# Patient Record
Sex: Female | Born: 1975 | Race: White | Hispanic: No | Marital: Married | Smoking: Never smoker
Health system: Southern US, Community
[De-identification: ages and names within clinical notes are randomized; demographics above are authoritative.]

## PROBLEM LIST (undated history)

## (undated) DIAGNOSIS — E039 Hypothyroidism, unspecified: Secondary | ICD-10-CM

## (undated) DIAGNOSIS — E559 Vitamin D deficiency, unspecified: Secondary | ICD-10-CM

## (undated) DIAGNOSIS — S0300XA Dislocation of jaw, unspecified side, initial encounter: Secondary | ICD-10-CM

## (undated) HISTORY — DX: Hypothyroidism, unspecified: E03.9

## (undated) HISTORY — PX: MOUTH SURGERY: SHX715

## (undated) HISTORY — DX: Dislocation of jaw, unspecified side, initial encounter: S03.00XA

## (undated) HISTORY — DX: Vitamin D deficiency, unspecified: E55.9

---

## 2000-05-25 ENCOUNTER — Other Ambulatory Visit: Admission: RE | Admit: 2000-05-25 | Discharge: 2000-05-25 | Payer: Self-pay | Admitting: Gynecology

## 2000-12-18 ENCOUNTER — Inpatient Hospital Stay (HOSPITAL_COMMUNITY): Admission: AD | Admit: 2000-12-18 | Discharge: 2000-12-20 | Payer: Self-pay | Admitting: Gynecology

## 2000-12-18 ENCOUNTER — Encounter (INDEPENDENT_AMBULATORY_CARE_PROVIDER_SITE_OTHER): Payer: Self-pay | Admitting: Specialist

## 2001-01-28 ENCOUNTER — Other Ambulatory Visit: Admission: RE | Admit: 2001-01-28 | Discharge: 2001-01-28 | Payer: Self-pay | Admitting: Gynecology

## 2002-07-07 ENCOUNTER — Other Ambulatory Visit: Admission: RE | Admit: 2002-07-07 | Discharge: 2002-07-07 | Payer: Self-pay | Admitting: Obstetrics and Gynecology

## 2003-01-22 ENCOUNTER — Inpatient Hospital Stay (HOSPITAL_COMMUNITY): Admission: AD | Admit: 2003-01-22 | Discharge: 2003-01-24 | Payer: Self-pay | Admitting: Obstetrics and Gynecology

## 2003-03-07 ENCOUNTER — Other Ambulatory Visit: Admission: RE | Admit: 2003-03-07 | Discharge: 2003-03-07 | Payer: Self-pay | Admitting: Obstetrics and Gynecology

## 2004-07-02 ENCOUNTER — Other Ambulatory Visit: Admission: RE | Admit: 2004-07-02 | Discharge: 2004-07-02 | Payer: Self-pay | Admitting: Obstetrics and Gynecology

## 2004-12-04 ENCOUNTER — Other Ambulatory Visit: Admission: RE | Admit: 2004-12-04 | Discharge: 2004-12-04 | Payer: Self-pay | Admitting: Obstetrics and Gynecology

## 2007-10-06 ENCOUNTER — Ambulatory Visit (HOSPITAL_COMMUNITY): Admission: RE | Admit: 2007-10-06 | Discharge: 2007-10-06 | Payer: Self-pay | Admitting: Obstetrics and Gynecology

## 2008-06-29 ENCOUNTER — Inpatient Hospital Stay (HOSPITAL_COMMUNITY): Admission: RE | Admit: 2008-06-29 | Discharge: 2008-07-01 | Payer: Self-pay | Admitting: Obstetrics and Gynecology

## 2010-05-20 LAB — CBC
Hemoglobin: 9.9 g/dL — ABNORMAL LOW (ref 12.0–15.0)
MCV: 89.9 fL (ref 78.0–100.0)
Platelets: 155 10*3/uL (ref 150–400)
RBC: 3.12 MIL/uL — ABNORMAL LOW (ref 3.87–5.11)
RDW: 12.8 % (ref 11.5–15.5)
RDW: 13.1 % (ref 11.5–15.5)
WBC: 8.5 10*3/uL (ref 4.0–10.5)

## 2010-05-20 LAB — RPR: RPR Ser Ql: NONREACTIVE

## 2010-06-27 NOTE — Discharge Summary (Signed)
[facility]  Patient:    Danielle Jenkins, Danielle Jenkins Visit Number: [id number] MRN: [id number]          Service Type: OBS Location: [id number] Attending Physician:  Douglass Rivers Dictated by:   Antony Contras, [facility] Admit Date:  12/18/2000 Discharge Date: 12/20/2000                             Discharge Summary  DISCHARGE DIAGNOSES: 1. Intrauterine pregnancy at term with spontaneous onset of labor. 2. History of positive ANA.  PROCEDURES:  Normal spontaneous vaginal delivery with delivery of a viable infant over a second degree midline episiotomy.  HISTORY OF PRESENT ILLNESS:  The patient is a 35 year old primigravida with a last menstrual period of 03/16/00, estimated date of confinement 12/19/00. Prenatal course was complicated with a history of positive ANA for which the patient was treated with baby aspirin.  LABORATORY DATA:  Blood type A+, antibody screen negative, RPR nonreactive. Rubella immune.  GBS is positive.  HOSPITAL COURSE AND TREATMENT:  The patient was admitted on 12/18/00, spontaneous onset of labor.  Cervix was 7 to 8 cm, 80%, -3 station.  The patient progressed rapidly to complete dilatation.  Delivered an Apgar 8 and 10 female infant weighing 6 pounds 7 ounces over a second degree midline episiotomy.  There was some thick meconium fluid.  Thick ______ meconium. The infant was DeLee suctioned, peds in attendance.  POSTPARTUM COURSE:  The patient remained afebrile.  Was able to be discharged on her second postpartum day in satisfactory condition.  CBC 30.6, hemoglobin 10.9, white blood cell count 10.2, platelets 125.  DISPOSITION:  Follow up in six weeks.  DISCHARGE MEDICATIONS: 1. Prenatal vitamins. 2. Iron. 3. Motrin. 4. Tylox. Dictated by:   Antony Contras, [facility] Attending Physician:  Douglass Rivers DD:  12/31/00 TD:  01/01/01 Job: [id number]

## 2010-11-28 ENCOUNTER — Encounter: Payer: Self-pay | Admitting: Genetic Counselor

## 2012-11-22 ENCOUNTER — Other Ambulatory Visit: Payer: Self-pay | Admitting: Obstetrics and Gynecology

## 2012-11-22 DIAGNOSIS — R928 Other abnormal and inconclusive findings on diagnostic imaging of breast: Secondary | ICD-10-CM

## 2012-12-05 ENCOUNTER — Ambulatory Visit
Admission: RE | Admit: 2012-12-05 | Discharge: 2012-12-05 | Disposition: A | Discharge status: Home / Self Care | Payer: Private Health Insurance - Indemnity | Source: Ambulatory Visit | Attending: Obstetrics and Gynecology | Admitting: Obstetrics and Gynecology

## 2012-12-05 DIAGNOSIS — R928 Other abnormal and inconclusive findings on diagnostic imaging of breast: Secondary | ICD-10-CM

## 2013-05-08 ENCOUNTER — Ambulatory Visit: Payer: Managed Care, Other (non HMO) | Admitting: Emergency Medicine

## 2013-05-08 VITALS — BP 106/62 | HR 89 | Temp 102.1°F | Resp 18 | Ht 65.0 in | Wt 161.8 lb

## 2013-05-08 DIAGNOSIS — J111 Influenza due to unidentified influenza virus with other respiratory manifestations: Secondary | ICD-10-CM

## 2013-05-08 MED ORDER — PROMETHAZINE-CODEINE 6.25-10 MG/5ML PO SYRP: 5.0000 mL | ORAL_SOLUTION | Freq: Four times a day (QID) | ORAL | Status: DC | PRN

## 2013-05-08 MED ORDER — OSELTAMIVIR PHOSPHATE 75 MG PO CAPS: 75.0000 mg | ORAL_CAPSULE | Freq: Two times a day (BID) | ORAL | Status: DC

## 2013-05-08 NOTE — Patient Instructions (Signed)

## 2013-05-08 NOTE — Progress Notes (Signed)
[facility] [address], [place] [address] [phone]  Date:  05/08/2013   Name:  Danielle Jenkins   DOB:  Dec 06, 1975   MRN:  [id number]  PCP:  No primary provider on file.    Chief Complaint: Cough, Fever and Generalized Body Aches   History of Present Illness:  Danielle Jenkins is a 38 y.o. very pleasant female patient who presents with the following:  Ill today with fever to 102, myalgias, fatigue, and a cough.  No wheezing or shortness of breath.  Nauseated but no vomiting.  No stool change or rash.  No ill contact.  Cough is not productive.  No rash or sore throat.  Sudden onset of symptoms this morning.  No improvement with over the counter medications or other home remedies. Denies other complaint or health concern today.   There are no active problems to display for this patient.   No past medical history on file.  No past surgical history on file.  History  Substance Use Topics  . Smoking status: Never Smoker   . Smokeless tobacco: Never Used  . Alcohol Use: Yes     Comment: occ    No family history on file.  No Known Allergies  Medication list has been reviewed and updated.  No current outpatient prescriptions on file prior to visit.   No current facility-administered medications on file prior to visit.    Review of Systems:  As per HPI, otherwise negative.    Physical Examination: Filed Vitals:   05/08/13 1807  BP: 106/62  Pulse: 89  Temp: 102.1 F (38.9 C)  Resp: 18   Filed Vitals:   05/08/13 1807  Height: 5' 5"  (1.651 m)  Weight: 161 lb 12.8 oz (73.392 kg)   Body mass index is 26.92 kg/(m^2). Ideal Body Weight: Weight in (lb) to have BMI = 25: 149.9  GEN: WDWN, NAD, Non-toxic, A & O x 3 HEENT: Atraumatic, Normocephalic. Neck supple. No masses, No LAD. Ears and Nose: No external deformity. CV: RRR, No M/G/R. No JVD. No thrill. No extra heart sounds. PULM: CTA B, no wheezes, crackles, rhonchi. No retractions. No  resp. distress. No accessory muscle use. ABD: S, NT, ND, +BS. No rebound. No HSM. EXTR: No c/c/e NEURO Normal gait.  PSYCH: Normally interactive. Conversant. Not depressed or anxious appearing.  Calm demeanor.    Assessment and Plan: Influenza tamiflu Phen c cod  Signed,  Ellison Carwin, MD

## 2013-07-11 ENCOUNTER — Other Ambulatory Visit: Payer: Self-pay | Admitting: Obstetrics and Gynecology

## 2013-07-11 DIAGNOSIS — R922 Inconclusive mammogram: Secondary | ICD-10-CM

## 2014-01-22 ENCOUNTER — Ambulatory Visit (INDEPENDENT_AMBULATORY_CARE_PROVIDER_SITE_OTHER): Payer: Managed Care, Other (non HMO) | Admitting: Internal Medicine

## 2014-01-22 DIAGNOSIS — M25512 Pain in left shoulder: Secondary | ICD-10-CM

## 2014-01-22 DIAGNOSIS — M542 Cervicalgia: Secondary | ICD-10-CM

## 2014-01-22 MED ORDER — MELOXICAM 15 MG PO TABS: 15.0000 mg | ORAL_TABLET | Freq: Every day | ORAL | Status: DC

## 2014-01-22 MED ORDER — CYCLOBENZAPRINE HCL 10 MG PO TABS
10.0000 mg | ORAL_TABLET | Freq: Every day | ORAL | Status: DC
Start: 2014-01-22 — End: 2021-05-02
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
## 2014-01-25 ENCOUNTER — Other Ambulatory Visit: Payer: Self-pay | Admitting: Obstetrics and Gynecology

## 2014-01-25 DIAGNOSIS — Z9189 Other specified personal risk factors, not elsewhere classified: Secondary | ICD-10-CM

## 2014-01-25 DIAGNOSIS — R922 Inconclusive mammogram: Secondary | ICD-10-CM

## 2014-01-28 ENCOUNTER — Ambulatory Visit (INDEPENDENT_AMBULATORY_CARE_PROVIDER_SITE_OTHER): Payer: Managed Care, Other (non HMO) | Admitting: Physician Assistant

## 2014-01-28 VITALS — BP 114/78 | HR 78 | Temp 98.3°F | Resp 18 | Ht 64.75 in | Wt 162.2 lb

## 2014-01-28 DIAGNOSIS — J029 Acute pharyngitis, unspecified: Secondary | ICD-10-CM

## 2014-01-28 LAB — POCT RAPID STREP A (OFFICE): Rapid Strep A Screen: NEGATIVE

## 2014-01-28 NOTE — Progress Notes (Signed)
   Subjective:    Patient ID: Danielle Jenkins, female    DOB: 09/17/1975, 38 y.o.   MRN: [id number]  HPI  This is a 38 year old female with no significant PMH presenting with sore throat x 1 day. She was in clinic today with her daughter with same symptoms who tested positive for strep. She is wanting to be tested as well. She denies any additional symptoms - no fever, chills, otalgia, nasal congestion, sinus pressure, cough, abdominal pain, N/V/D. She has not tried anything for her symptoms.   Review of Systems  Constitutional: Negative for fever and chills.  HENT: Positive for sore throat. Negative for congestion, ear pain and sinus pressure.   Eyes: Negative for redness.  Respiratory: Negative for cough.   Gastrointestinal: Negative for nausea, vomiting, abdominal pain and diarrhea.  Skin: Negative for rash.  Neurological: Negative for headaches.  Psychiatric/Behavioral: Negative for sleep disturbance.    There are no active problems to display for this patient.  Prior to Admission medications   Medication Sig Start Date End Date Taking? Authorizing Provider  cyclobenzaprine (FLEXERIL) 10 MG tablet Take 1 tablet (10 mg total) by mouth at bedtime. 01/22/14  Yes Leandrew Koyanagi, MD  meloxicam (MOBIC) 15 MG tablet Take 1 tablet (15 mg total) by mouth daily. 01/22/14  Yes Leandrew Koyanagi, MD   No Known Allergies  Patient's social and family history were reviewed.     Objective:   Physical Exam  Constitutional: She is oriented to person, place, and time. She appears well-developed and well-nourished. No distress.  HENT:  Head: Normocephalic and atraumatic.  Right Ear: Hearing, tympanic membrane, external ear and ear canal normal.  Left Ear: Hearing, tympanic membrane, external ear and ear canal normal.  Nose: Nose normal.  Mouth/Throat: Uvula is midline and mucous membranes are normal. Posterior oropharyngeal erythema present. No oropharyngeal exudate or posterior  oropharyngeal edema.  Eyes: Conjunctivae and lids are normal. Right eye exhibits no discharge. Left eye exhibits no discharge. No scleral icterus.  Neck: Trachea normal.  Cardiovascular: Normal rate, regular rhythm, normal heart sounds, intact distal pulses and normal pulses.   No murmur heard. Pulmonary/Chest: Effort normal and breath sounds normal. No respiratory distress. She has no wheezes. She has no rhonchi. She has no rales.  Musculoskeletal: Normal range of motion.  Lymphadenopathy:    She has no cervical adenopathy.  Neurological: She is alert and oriented to person, place, and time.  Skin: Skin is warm, dry and intact. No lesion and no rash noted.  Psychiatric: She has a normal mood and affect. Her speech is normal and behavior is normal. Thought content normal.   Results for orders placed or performed in visit on 01/28/14  POCT rapid strep A  Result Value Ref Range   Rapid Strep A Screen Negative Negative      Assessment & Plan:  1. Sore throat Rapid strep negative, culture pending. Pt will use ibuprofen/tylenol for pain. Will return in 7-10 days if symptoms worsen or fail to improve.  - POCT rapid strep A - Culture, Group A Strep   Benjaman Pott. Drenda Freeze, MHS [facility]  01/28/2014

## 2014-01-28 NOTE — Patient Instructions (Signed)
I will call you with the results of your throat culture.  Return if symptoms do not improve in 7-10 days.

## 2014-01-30 LAB — CULTURE, GROUP A STREP: Organism ID, Bacteria: NORMAL

## 2014-02-13 ENCOUNTER — Ambulatory Visit
Admission: RE | Admit: 2014-02-13 | Discharge: 2014-02-13 | Disposition: A | Discharge status: Home / Self Care | Payer: 59 | Source: Ambulatory Visit | Attending: Obstetrics and Gynecology | Admitting: Obstetrics and Gynecology

## 2014-02-13 DIAGNOSIS — R9230 Dense breasts, unspecified: Secondary | ICD-10-CM

## 2014-02-13 DIAGNOSIS — R922 Inconclusive mammogram: Secondary | ICD-10-CM

## 2014-02-13 DIAGNOSIS — Z9189 Other specified personal risk factors, not elsewhere classified: Secondary | ICD-10-CM

## 2014-02-13 MED ORDER — GADOBENATE DIMEGLUMINE 529 MG/ML IV SOLN
14.0000 mL | Freq: Once | INTRAVENOUS | Status: AC | PRN
  Administered 2014-02-13: 14 mL via INTRAVENOUS

## 2014-02-14 ENCOUNTER — Other Ambulatory Visit: Payer: Self-pay | Admitting: Obstetrics and Gynecology

## 2014-02-14 ENCOUNTER — Ambulatory Visit: Payer: Managed Care, Other (non HMO) | Attending: Internal Medicine | Admitting: Physical Therapy

## 2014-02-14 DIAGNOSIS — M542 Cervicalgia: Secondary | ICD-10-CM | POA: Diagnosis not present

## 2014-02-14 DIAGNOSIS — M25512 Pain in left shoulder: Secondary | ICD-10-CM | POA: Insufficient documentation

## 2014-02-14 DIAGNOSIS — R208 Other disturbances of skin sensation: Secondary | ICD-10-CM | POA: Insufficient documentation

## 2014-02-14 DIAGNOSIS — R928 Other abnormal and inconclusive findings on diagnostic imaging of breast: Secondary | ICD-10-CM

## 2014-02-14 DIAGNOSIS — R209 Unspecified disturbances of skin sensation: Secondary | ICD-10-CM

## 2014-02-14 NOTE — Patient Instructions (Signed)
  Flexibility: Upper Trapezius Stretch   Gently grasp right side of head while reaching behind back with other hand. Tilt head away until a gentle stretch is felt. Hold _30___ seconds. Repeat __2-3__ times per set. Do _1__ sets per session. Do _2___ sessions per day.  http://orth.exer.us/340   Levator Stretch   Grasp seat or sit on hand on side to be stretched. Turn head toward other side and look down. Use hand on head to gently stretch neck in that position. Hold _30___ seconds. Repeat on other side. Repeat _2-3___ times. Do ___2_ sessions per day.  http://[email or web address].exer.us/30     Flexibility: Neck Retraction   Pull head straight back, keeping eyes and jaw level. Repeat __3-5_ times per set. Do ___1-2_ sets per session. Do _2___ sessions per day.  http://orth.exer.us/344   Posture - Sitting   Sit upright, head facing forward. Try using a roll to support lower back. Keep shoulders relaxed, and avoid rounded back. Keep hips level with knees. Avoid crossing legs for long periods.   Flexibility: Corner Stretch   Standing in corner with hands just above shoulder level and feet __12__ inches from corner, lean forward until a comfortable stretch is felt across chest. Hold __30__ seconds. Repeat _3___ times per set. Do _1__ sets per session. Do __2-3_ sessions per day.  http://orth.exer.us/342   Copyright  VHI. All rights reserved.
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
## 2014-02-19 ENCOUNTER — Ambulatory Visit
Admission: RE | Admit: 2014-02-19 | Discharge: 2014-02-19 | Disposition: A | Discharge status: Home / Self Care | Payer: 59 | Source: Ambulatory Visit | Attending: Obstetrics and Gynecology | Admitting: Obstetrics and Gynecology

## 2014-02-19 DIAGNOSIS — R928 Other abnormal and inconclusive findings on diagnostic imaging of breast: Secondary | ICD-10-CM

## 2014-02-19 MED ORDER — GADOBENATE DIMEGLUMINE 529 MG/ML IV SOLN
14.0000 mL | Freq: Once | INTRAVENOUS | Status: AC | PRN
  Administered 2014-02-19: 14 mL via INTRAVENOUS

## 2014-02-27 ENCOUNTER — Ambulatory Visit: Payer: Managed Care, Other (non HMO) | Admitting: Physical Therapy

## 2014-02-27 DIAGNOSIS — M542 Cervicalgia: Secondary | ICD-10-CM

## 2014-02-27 DIAGNOSIS — R209 Unspecified disturbances of skin sensation: Secondary | ICD-10-CM

## 2014-02-27 DIAGNOSIS — R208 Other disturbances of skin sensation: Secondary | ICD-10-CM | POA: Diagnosis not present

## 2014-02-27 DIAGNOSIS — M25512 Pain in left shoulder: Secondary | ICD-10-CM

## 2014-02-27 NOTE — Therapy (Signed)
[place], [place], [address] Phone: [phone]   Fax:  [phone]  Physical Therapy Treatment  Patient Details  Name: Danielle Jenkins MRN: [id number] Date of Birth: 03-15-1975 Referring Provider:  Leandrew Koyanagi, MD  Encounter Date: 02/27/2014      PT End of Session - 02/27/14 0954    Visit Number 2   Number of Visits 8   Date for PT Re-Evaluation 03/21/14   PT Start Time 0936   PT Stop Time 1020   PT Time Calculation (min) 44 min   Activity Tolerance Patient tolerated treatment well      No past medical history on file.  No past surgical history on file.  There were no vitals taken for this visit.  Visit Diagnosis:  Sensory disturbance  Pain in neck  Pain in joint, shoulder region, left      Subjective Assessment - 02/27/14 0940    Symptoms Feeling much better, have been getting a massage and so now it just feels tingly and a littel tight in L scap.    Currently in Pain? Yes   Pain Score 1    Pain Location Scapula   Pain Orientation Left;Posterior   Pain Type Acute pain   Pain Onset More than a month ago   Pain Frequency Intermittent   Multiple Pain Sites No                    [facility] Adult PT Treatment/Exercise - 02/27/14 0942    Exercises   Exercises --  UBE level 1, 5 min (FW and back)   Neck Exercises: Stretches   Upper Trapezius Stretch 2 reps;30 seconds   Upper Trapezius Stretch Limitations L   Levator Stretch 2 reps;30 seconds   Levator Stretch Limitations L   Corner Stretch 3 reps;30 seconds   Other Neck Stretches horiz abd red on foam roll x 20   Other Neck Stretches dead bug foam roller and LE lifts for core stab   Neck Exercises: Seated   Neck Retraction 5 reps   Ultrasound   Ultrasound Location L upper trap and levator, med scap border   Ultrasound Parameters 1.2 W/cm and 100% duty, 1 MHz   Ultrasound Goals Pain   Manual Therapy   Manual  Therapy Massage;Scapular mobilization   Massage to affected mm  including upper trap, levator scap, infrapsinatus                PT Education - 02/27/14 1020    Education provided Yes   Education Details scapular mob, foam roller ex   Person(s) Educated Patient   Methods Explanation;Demonstration   Comprehension Verbalized understanding;Returned demonstration             PT Long Term Goals - 02/27/14 1016    PT LONG TERM GOAL #1   Title Pt. will demo corrected posture and understand implications for pain relief, muscle imbalance.    Status Achieved   PT LONG TERM GOAL #2   Title Pt. will report no sensory disturbance in LUE with normal daily activities.    Status On-going   PT LONG TERM GOAL #3   Title Pt. will sit/work for up to 1 hour without pain in LUE.    Status On-going   PT LONG TERM GOAL #4   Title Pt will report no pain with cervical AROM for driving, sleeping.    Status On-going   PT LONG TERM GOAL #5  Title Pt. will return to weight training without  sx/pain increasing   Status On-going               Plan - 02/27/14 1104    Clinical Impression Statement Patient reports improvement in symptoms, pain and stiffness is less, but does build during the day with extended periods of sitting, work.  SHe has been receiving massage and that is helping a great deal.  See goals met.    PT Treatment/Interventions ADLs/Self Care Home Management;Moist Heat;Patient/family education;Passive range of motion;Therapeutic exercise;Ultrasound;Manual techniques;Dry needling;Cryotherapy;Neuromuscular re-education;Electrical Stimulation   PT Next Visit Plan try Tower scap glides with push through bar and prone scap (long box)   PT Home Exercise Plan continue may add scap mob         Problem List There are no active problems to display for this patient.   [name],Tamani 02/27/2014, 11:10 AM  [facility] [address] [place], [place], [address] Phone: [phone]   Fax:  [phone]

## 2014-03-05 ENCOUNTER — Ambulatory Visit: Payer: Managed Care, Other (non HMO) | Admitting: Rehabilitation

## 2014-03-05 ENCOUNTER — Telehealth: Payer: Self-pay | Admitting: *Deleted

## 2014-03-05 DIAGNOSIS — M542 Cervicalgia: Secondary | ICD-10-CM

## 2014-03-05 DIAGNOSIS — R208 Other disturbances of skin sensation: Secondary | ICD-10-CM | POA: Diagnosis not present

## 2014-03-05 DIAGNOSIS — R209 Unspecified disturbances of skin sensation: Secondary | ICD-10-CM

## 2014-03-05 DIAGNOSIS — M25512 Pain in left shoulder: Secondary | ICD-10-CM

## 2014-03-05 NOTE — Patient Instructions (Addendum)
  Resistive Band Rowing   With resistive band anchored in door, grasp both ends. Keeping elbows bent, pull back, squeezing shoulder blades together. Hold _5___ seconds. Repeat _15___ times. Do ____ sessions per day.  http://gt2.exer.us/97   Copyright  VHI. All rights reserved.  Lay on [employer]. Spread arms apart, pulling band to touch chest level. Repeat 15 times /2 times a day

## 2014-03-05 NOTE — Therapy (Signed)
[place], [place], [address] Phone: [phone]   Fax:  [phone]  Physical Therapy Treatment  Patient Details  Name: Danielle Jenkins MRN: [id number] Date of Birth: 03-Aug-1975 Referring Provider:  Leandrew Koyanagi, MD  Encounter Date: 03/05/2014      PT End of Session - 03/05/14 1205    Visit Number 3   Number of Visits 8   Date for PT Re-Evaluation 03/21/14   PT Start Time 4967   PT Stop Time 1227   PT Time Calculation (min) 40 min      No past medical history on file.  No past surgical history on file.  There were no vitals taken for this visit.  Visit Diagnosis:  Sensory disturbance  Pain in neck  Pain in joint, shoulder region, left      Subjective Assessment - 03/05/14 1149    Symptoms Not pain, uncomfortable and it gets numb alot.    Currently in Pain? No/denies   Aggravating Factors  prolonged sitting, driving   Pain Relieving Factors heat, changing positions   Multiple Pain Sites No          [facility] PT Assessment - 03/05/14 1155    AROM   Cervical Flexion 55  no pain   Cervical - Right Side Bend 40  pain left   Cervical - Left Side Bend 52   Cervical - Right Rotation 80   Cervical - Left Rotation 80                  [facility] Adult PT Treatment/Exercise - 03/05/14 1159    Exercises   Exercises --  UBE L 1.5 3 for 3 back   Neck Exercises: Stretches   Corner Stretch 3 reps;30 seconds   Other Neck Stretches pec stretch on soft foam roller x 1 min   Other Neck Stretches red band horiz abd, diagonals x 10 ea   Neck Exercises: Supine   Neck Retraction 10 reps;5 secs  on foam roller   Shoulder Exercises: Standing   Extension 15 reps   Theraband Level (Shoulder Extension) Level 2 (Red)   Row 15 reps   Theraband Level (Shoulder Row) Level 2 (Red)   Ultrasound   Ultrasound Location Left upper trap, Levator   Ultrasound Parameters 1.4w/cm2 1MHZ   Ultrasound  Goals Pain                     PT Long Term Goals - 02/27/14 1016    PT LONG TERM GOAL #1   Title Pt. will demo corrected posture and understand implications for pain relief, muscle imbalance.    Status Achieved   PT LONG TERM GOAL #2   Title Pt. will report no sensory disturbance in LUE with normal daily activities.    Status On-going   PT LONG TERM GOAL #3   Title Pt. will sit/work for up to 1 hour without pain in LUE.    Status On-going   PT LONG TERM GOAL #4   Title Pt will report no pain with cervical AROM for driving, sleeping.    Status On-going   PT LONG TERM GOAL #5   Title Pt. will return to weight training without  sx/pain increasing   Status On-going               Plan - 03/05/14 1151    Clinical Impression Statement AROM improved. Pt reports no longer has sharp pains associated  with cervical rotation when driving. Pain still increases with prolonged sitting activities. Decreased pain with sleep positions.    PT Next Visit Plan review bands, try Tower scap glides with push through bar and prone scap (long box)        Problem List There are no active problems to display for this patient.   Dorene Ar, [place] 03/05/2014, 12:30 PM  [place], [place], [address] Phone: [phone]   Fax:  [phone]

## 2014-03-07 ENCOUNTER — Ambulatory Visit: Payer: Managed Care, Other (non HMO) | Admitting: Rehabilitation

## 2014-03-07 DIAGNOSIS — R208 Other disturbances of skin sensation: Secondary | ICD-10-CM | POA: Diagnosis not present

## 2014-03-07 DIAGNOSIS — M25512 Pain in left shoulder: Secondary | ICD-10-CM

## 2014-03-07 DIAGNOSIS — R209 Unspecified disturbances of skin sensation: Secondary | ICD-10-CM

## 2014-03-07 DIAGNOSIS — M542 Cervicalgia: Secondary | ICD-10-CM

## 2014-03-08 NOTE — Therapy (Signed)
[place], [place], [address] Phone: [phone]   Fax:  [phone]  Physical Therapy Treatment  Patient Details  Name: Danielle Jenkins MRN: [id number] Date of Birth: 07/23/1975 Referring Provider:  Leandrew Koyanagi, MD  Encounter Date: 03/07/2014      PT End of Session - 03/08/14 4801    Visit Number 4   Number of Visits 8   Date for PT Re-Evaluation 03/21/14   PT Start Time 6553   PT Stop Time 1225   PT Time Calculation (min) 40 min      No past medical history on file.  No past surgical history on file.  There were no vitals taken for this visit.  Visit Diagnosis:  Sensory disturbance  Pain in neck  Pain in joint, shoulder region, left      Subjective Assessment - 03/07/14 1149    Symptoms Still getting numb a lot. Maybe a little less discomfort.    Currently in Pain? No/denies                    [facility] Adult PT Treatment/Exercise - 03/07/14 1239    Exercises   Exercises --  UBE L 2,  3 for 3 back   Neck Exercises: Stretches   Corner Stretch 3 reps;30 seconds   Neck Exercises: Supine   Other Supine Exercise Spring board: black straps: Supine arm work in hooklying, Posterior Deltoid pull, Arm circles x 8 each way;  cues for neutral and shoulder depression   Other Supine Exercise Supine Dead bug on foam roller- difficult   Shoulder Exercises: Prone   Other Prone Exercises Reformer: Long Box  Pulling straps, triceps pull back 8 x 2 each; trial of overhead press caused numbness so discontinued.   cues for core contract and shoulder depression   Shoulder Exercises: ROM/Strengthening   Other ROM/Strengthening Exercises Quadruped: protract/retract x 15, opp arm/LE lifts x 10  cues for neutral and core bracing   Ultrasound   Ultrasound Location left upper trap, levator medial border of scap   Ultrasound Parameters 1.6w/cm2  x 8 min   Ultrasound Goals Pain                      PT Long Term Goals - 02/27/14 1016    PT LONG TERM GOAL #1   Title Pt. will demo corrected posture and understand implications for pain relief, muscle imbalance.    Status Achieved   PT LONG TERM GOAL #2   Title Pt. will report no sensory disturbance in LUE with normal daily activities.    Status On-going   PT LONG TERM GOAL #3   Title Pt. will sit/work for up to 1 hour without pain in LUE.    Status On-going   PT LONG TERM GOAL #4   Title Pt will report no pain with cervical AROM for driving, sleeping.    Status On-going   PT LONG TERM GOAL #5   Title Pt. will return to weight training without  sx/pain increasing   Status On-going               Plan - 03/08/14 0809    Clinical Impression Statement Low level pain in left upper trap, levator, and continued numbess left scapula area provoked by over head activity and when arms are down by sides while sitting.    PT Next Visit Plan review bands, try Tower scap glides with push  through bar and prone scap (long box)        Problem List There are no active problems to display for this patient.   Dorene Ar, [place] 03/08/2014, 8:14 AM  [facility] [address] [place], [place], [address] Phone: [phone]   Fax:  [phone]

## 2014-03-12 ENCOUNTER — Ambulatory Visit: Payer: Managed Care, Other (non HMO) | Attending: Internal Medicine | Admitting: Physical Therapy

## 2014-03-12 DIAGNOSIS — M25512 Pain in left shoulder: Secondary | ICD-10-CM | POA: Diagnosis not present

## 2014-03-12 DIAGNOSIS — R209 Unspecified disturbances of skin sensation: Secondary | ICD-10-CM

## 2014-03-12 DIAGNOSIS — R208 Other disturbances of skin sensation: Secondary | ICD-10-CM | POA: Insufficient documentation

## 2014-03-12 DIAGNOSIS — M542 Cervicalgia: Secondary | ICD-10-CM

## 2014-03-12 NOTE — Patient Instructions (Signed)
Showed patient how to use tennis ball at medial scap border for trigger point release and improving circulation Discussed Pilates, core strength Mech cervical traction

## 2014-03-12 NOTE — Therapy (Signed)
[place], [place], [address] Phone: [phone]   Fax:  [phone]  Physical Therapy Treatment  Patient Details  Name: Danielle Jenkins MRN: [id number] Date of Birth: [birth year] Referring Provider:  Leandrew Koyanagi, MD  Encounter Date: 03/12/2014      PT End of Session - 03/12/14 0900    Visit Number 5   Number of Visits 8   Date for PT Re-Evaluation 03/21/14   PT Start Time 5573   PT Stop Time 0928   PT Time Calculation (min) 41 min   Activity Tolerance Patient tolerated treatment well      No past medical history on file.  No past surgical history on file.  There were no vitals taken for this visit.  Visit Diagnosis:  Sensory disturbance  Pain in neck  Pain in joint, shoulder region, left      Subjective Assessment - 03/12/14 0852    Symptoms Numb already in Lt scapula, which is unusual   Limitations Sitting;Lifting;House hold activities   How long can you sit comfortably? improved to a few hours   How long can you stand comfortably? No problem   How long can you walk comfortably? No problem   Currently in Pain? No/denies          [facility] Adult PT Treatment/Exercise - 03/12/14 0903    Ultrasound   Ultrasound Location L upper trap and levator scap, med border with biofreeze   Ultrasound Parameters 1.6w/cm2   Ultrasound Goals Pain   Traction   Type of Traction Cervical   Min (lbs) 5   Max (lbs) 13   Hold Time 60   Rest Time 15   Time 15          PT Education - 03/12/14 0903    Education provided Yes   Education Details traction   Person(s) Educated Patient   Methods Explanation   Comprehension Verbalized understanding             PT Long Term Goals - 03/12/14 0900    PT LONG TERM GOAL #2   Title Pt. will report no sensory disturbance in LUE with normal daily activities.    Status On-going   PT LONG TERM GOAL #3   Title Pt. will sit/work for up to 1 hour  without pain in LUE.    Status On-going   PT LONG TERM GOAL #4   Title Pt will report no pain with cervical AROM for driving, sleeping.    Status On-going   PT LONG TERM GOAL #5   Title Pt. will return to weight training without  sx/pain increasing   Status On-going               Plan - 03/12/14 0901    Clinical Impression Statement Patient reports improved AROM in C-spine with driving, pain is very min at the end of the day but numbenss continues.  No numbness following traction.    PT Next Visit Plan cont with Pilates reformer, tower and traction if favorable   PT Home Exercise Plan cont with current   Consulted and Agree with Plan of Care Patient        Problem List There are no active problems to display for this patient.   Donte Kary,Hilja 03/12/2014, 12:54 PM  [facility] [address] [place], [place], [address] Phone: [phone]   Fax:  [phone]

## 2014-03-15 ENCOUNTER — Telehealth: Payer: Self-pay | Admitting: *Deleted

## 2014-03-15 ENCOUNTER — Ambulatory Visit: Payer: Managed Care, Other (non HMO) | Admitting: Physical Therapy

## 2014-03-15 DIAGNOSIS — R209 Unspecified disturbances of skin sensation: Secondary | ICD-10-CM

## 2014-03-15 DIAGNOSIS — M542 Cervicalgia: Secondary | ICD-10-CM

## 2014-03-15 DIAGNOSIS — R208 Other disturbances of skin sensation: Secondary | ICD-10-CM | POA: Diagnosis not present

## 2014-03-15 DIAGNOSIS — M25512 Pain in left shoulder: Secondary | ICD-10-CM

## 2014-03-15 NOTE — Telephone Encounter (Signed)
APPTS MADE AND PRINTED.Marland KitchenMarland KitchenTD

## 2014-03-15 NOTE — Therapy (Signed)
[place], [place], [address] Phone: [phone]   Fax:  [phone]  Physical Therapy Treatment/Renewal  Patient Details  Name: Danielle Jenkins MRN: [id number] Date of Birth: 08/20/1975 Referring Provider:  Leandrew Koyanagi, MD  Encounter Date: 03/15/2014      PT End of Session - 03/15/14 1601    Visit Number 6   Number of Visits 16   Date for PT Re-Evaluation 04/18/14   PT Start Time 3875   PT Stop Time 1630   PT Time Calculation (min) 43 min   Activity Tolerance Patient tolerated treatment well      No past medical history on file.  No past surgical history on file.  There were no vitals taken for this visit.  Visit Diagnosis:  Sensory disturbance  Pain in neck  Pain in joint, shoulder region, left      Subjective Assessment - 03/15/14 1600    Symptoms Numbness didnt start until 330 today. Traction helped alot!   Currently in Pain? No/denies          [facility] PT Assessment - 03/15/14 1553    AROM   Cervical Flexion 55   Cervical Extension 70   Cervical - Right Side Bend 40   Cervical - Left Side Bend 50   Cervical - Right Rotation 80   Cervical - Left Rotation 80          [facility] Adult PT Treatment/Exercise - 03/15/14 1611    Traction   Min (lbs) 8   Max (lbs) 15   Hold Time 60   Rest Time 15   Time 15    Ther Ex Pilates Tower supine arm/Ab series:  Arm  Arcs, Circles, done with hooklying and then hips/knees at 90/90  Used ball under sacrum to challenge stability.    Abd curl (hundred prep) with [place] Good head neck and shoulder alignment with ab curl No pain post.   Advised Ice after traction if needed.        PT Long Term Goals - 03/15/14 1616    PT LONG TERM GOAL #1   Title Pt. will demo corrected posture and understand implications for pain relief, muscle imbalance.    Status Achieved   PT LONG TERM GOAL #2   Title Pt. will report no sensory  disturbance in LUE with normal daily activities.    Status On-going   PT LONG TERM GOAL #3   Title Pt. will sit/work for up to 1 hour without pain in LUE.    Status Partially Met   PT LONG TERM GOAL #4   Title Pt will report no pain with cervical AROM for driving, sleeping.    Status Achieved   PT LONG TERM GOAL #5   Title Pt. will return to weight training without  sx/pain increasing   Status On-going   Additional Long Term Goals   Additional Long Term Goals Yes   PT LONG TERM GOAL #6   Title Pt will report no numbness in scapular region after working full day   Time 4   Period Weeks   Status New               Plan - 03/15/14 1614    Clinical Impression Statement Patient respinded favorably to traction. Will renew POC to ensure full resolution of symptoms and develop core HEP.    Pt will benefit from skilled therapeutic intervention in order to improve on the following  deficits Decreased mobility;Decreased strength;Pain;Impaired UE functional use;Increased fascial restricitons;Decreased range of motion;Impaired flexibility   Rehab Potential Excellent   PT Frequency 2x / week   PT Duration 4 weeks   PT Treatment/Interventions ADLs/Self Care Home Management;Moist Heat;Patient/family education;Passive range of motion;Therapeutic exercise;Ultrasound;Manual techniques;Dry needling;Cryotherapy;Neuromuscular re-education;Electrical Stimulation;Traction   PT Next Visit Plan cont with Pilates reformer, tower and traction if favorable   PT Home Exercise Plan cont with current   Consulted and Agree with Plan of Care Patient        Problem List There are no active problems to display for this patient.   PAA,Sonakshi 03/15/2014, 4:18 PM  [place], [place], [address] Phone: [phone]   Fax:  [phone]  Niya Behler, PT 03/15/2014 4:21 PM Phone: [phone] Fax: [phone]

## 2014-03-27 ENCOUNTER — Ambulatory Visit: Payer: Managed Care, Other (non HMO) | Admitting: Physical Therapy

## 2014-03-30 ENCOUNTER — Ambulatory Visit: Payer: Managed Care, Other (non HMO) | Admitting: Physical Therapy

## 2014-03-30 DIAGNOSIS — M542 Cervicalgia: Secondary | ICD-10-CM

## 2014-03-30 DIAGNOSIS — R208 Other disturbances of skin sensation: Secondary | ICD-10-CM | POA: Diagnosis not present

## 2014-03-30 DIAGNOSIS — R209 Unspecified disturbances of skin sensation: Secondary | ICD-10-CM

## 2014-03-30 DIAGNOSIS — M25512 Pain in left shoulder: Secondary | ICD-10-CM

## 2014-03-30 NOTE — Therapy (Signed)
[place], [place], [address] Phone: [phone]   Fax:  [phone]  Physical Therapy Treatment  Patient Details  Name: Danielle Jenkins MRN: [id number] Date of Birth: 04/20/1975 Referring Provider:  Leandrew Koyanagi, MD  Encounter Date: 03/30/2014      PT End of Session - 03/30/14 1014    Visit Number 7   Number of Visits 16   Date for PT Re-Evaluation 04/18/14   PT Start Time 0945   PT Stop Time 1006   PT Time Calculation (min) 21 min   Activity Tolerance Patient tolerated treatment well      No past medical history on file.  No past surgical history on file.  There were no vitals taken for this visit.  Visit Diagnosis:  Sensory disturbance  Pain in neck  Pain in joint, shoulder region, left      Subjective Assessment - 03/30/14 1008    Symptoms I think I'm better. I've returned to the gym, and havent had any pain.    Currently in Pain? No/denies          [facility] PT Assessment - 03/30/14 1011    AROM   Cervical Flexion 60   Cervical Extension 70   Cervical - Right Side Bend 40   Cervical - Left Side Bend 50   Cervical - Right Rotation 80   Cervical - Left Rotation 80       Gave patient a hand written list with explanation of foam roller exercises: Performed each of these exercises x 10-20 reps with min cueing.  Alternating Arms overhead Horiz abd with and without theraband for scapular stabilization Femur Arcs Dead bug (UE and LE lift) Clam bilat. And unilat.  Bridge with feet on roll Anterior hip stretch 30 sec each LE        PT Education - 03/30/14 1014    Education provided Yes   Education Details HEP for foam roller, Pilates studio contact info   Person(s) Educated Patient   Methods Explanation;Demonstration;Handout   Comprehension Verbalized understanding;Returned demonstration             PT Long Term Goals - 03/30/14 1015    PT LONG TERM GOAL #1   Title Pt. will demo corrected posture and understand implications for pain relief, muscle imbalance.    Status Achieved   PT LONG TERM GOAL #2   Title Pt. will report no sensory disturbance in LUE with normal daily activities.    Status Achieved   PT LONG TERM GOAL #3   Title Pt. will sit/work for up to 1 hour without pain in LUE.    Status Achieved   PT LONG TERM GOAL #4   Title Pt will report no pain with cervical AROM for driving, sleeping.    Status Achieved   PT LONG TERM GOAL #5   Title Pt. will return to weight training without  sx/pain increasing   Status Achieved   PT LONG TERM GOAL #6   Title Pt will report no numbness in scapular region after working full day   Status Achieved               Plan - 03/30/14 1015    Clinical Impression Statement Declined traction today.  DC to HEP and possible enrollment in Pilates classes.    PT Next Visit Plan NA   Consulted and Agree with Plan of Care Patient        Problem List  There are no active problems to display for this patient.   Danielle Jenkins 03/30/2014, 10:16 AM  [facility] [address] [place], [place], [address] Phone: [phone]   Fax:  [phone]

## 2014-03-30 NOTE — Patient Instructions (Signed)
Gave patient a hand written list with explanation of foam roller exercises: Performed each of these exercises x 10-20 reps with min cueing.  Alternating Arms overhead Horiz abd with and without theraband for scapular stabilization Femur Arcs Dead bug (UE and LE lift) Clam bilat. And unilat.  Bridge with feet on roll Anterior hip stretch 30 sec each LE

## 2014-04-02 ENCOUNTER — Encounter: Payer: Self-pay | Admitting: Physical Therapy

## 2014-04-02 NOTE — Therapy (Signed)
[place], [place], [address] Phone: [phone]   Fax:  [phone]  Patient Details  Name: Danielle Jenkins MRN: [id number] Date of Birth: [birth year] Referring Provider:  No ref. provider found  Encounter Date: 04/02/2014   PHYSICAL THERAPY DISCHARGE SUMMARY  Visits from Start of Care: 7  Current functional level related to goals / functional outcomes: See below for goals met.    Remaining deficits: None   Education / Equipment: HEP, posture, body mech, Pilates classes, core, foam roller ex. Plan: Patient agrees to discharge.  Patient goals were met. Patient is being discharged due to meeting the stated rehab goals.  ?????   Vallorie Niccoli, PT 04/02/2014 9:06 AM Phone: [phone] Fax: [phone]      PT Long Term Goals - 03/30/14 1015    PT LONG TERM GOAL #1   Title Pt. will demo corrected posture and understand implications for pain relief, muscle imbalance.    Status Achieved   PT LONG TERM GOAL #2   Title Pt. will report no sensory disturbance in LUE with normal daily activities.    Status Achieved   PT LONG TERM GOAL #3   Title Pt. will sit/work for up to 1 hour without pain in LUE.    Status Achieved   PT LONG TERM GOAL #4   Title Pt will report no pain with cervical AROM for driving, sleeping.    Status Achieved   PT LONG TERM GOAL #5   Title Pt. will return to weight training without sx/pain increasing   Status Achieved   PT LONG TERM GOAL #6   Title Pt will report no numbness in scapular region after working full day   Status Achieved             Laquetta Racey,[place] 04/02/2014, [place], [place], [address] Phone: [phone]   Fax:  [phone]

## 2014-04-04 ENCOUNTER — Encounter: Payer: 59 | Admitting: Rehabilitation

## 2014-04-06 ENCOUNTER — Encounter: Payer: 59 | Admitting: Physical Therapy

## 2014-07-19 ENCOUNTER — Other Ambulatory Visit: Payer: Self-pay | Admitting: Obstetrics and Gynecology

## 2014-07-19 DIAGNOSIS — Z9189 Other specified personal risk factors, not elsewhere classified: Secondary | ICD-10-CM

## 2014-07-19 DIAGNOSIS — R922 Inconclusive mammogram: Secondary | ICD-10-CM

## 2014-07-31 ENCOUNTER — Ambulatory Visit
Admission: RE | Admit: 2014-07-31 | Discharge: 2014-07-31 | Disposition: A | Discharge status: Home / Self Care | Payer: Managed Care, Other (non HMO) | Source: Ambulatory Visit | Attending: Obstetrics and Gynecology | Admitting: Obstetrics and Gynecology

## 2014-07-31 DIAGNOSIS — Z9189 Other specified personal risk factors, not elsewhere classified: Secondary | ICD-10-CM

## 2014-07-31 DIAGNOSIS — R922 Inconclusive mammogram: Secondary | ICD-10-CM

## 2014-07-31 MED ORDER — GADOBENATE DIMEGLUMINE 529 MG/ML IV SOLN
14.0000 mL | Freq: Once | INTRAVENOUS | Status: AC | PRN
  Administered 2014-07-31: 14 mL via INTRAVENOUS

## 2015-09-16 ENCOUNTER — Other Ambulatory Visit: Payer: Self-pay | Admitting: Obstetrics and Gynecology

## 2015-09-16 DIAGNOSIS — Z1231 Encounter for screening mammogram for malignant neoplasm of breast: Secondary | ICD-10-CM

## 2015-09-27 ENCOUNTER — Ambulatory Visit
Admission: RE | Admit: 2015-09-27 | Discharge: 2015-09-27 | Disposition: A | Discharge status: Home / Self Care | Payer: Managed Care, Other (non HMO) | Source: Ambulatory Visit | Attending: Obstetrics and Gynecology | Admitting: Obstetrics and Gynecology

## 2015-09-27 DIAGNOSIS — Z1231 Encounter for screening mammogram for malignant neoplasm of breast: Secondary | ICD-10-CM

## 2016-03-22 IMAGING — MG MM DIAGNOSTIC UNILATERAL L
2 series · 2 of 2 positions shown · non-contrast
Comparison: Previous exams

CLINICAL DATA: Patient is post MRI guided core needle biopsy of a 2
cm region of non mass enhancement in the upper-outer quadrant.

EXAM:
DIAGNOSTIC LEFT MAMMOGRAM POST MRI GUIDED BIOPSY

[L CC]
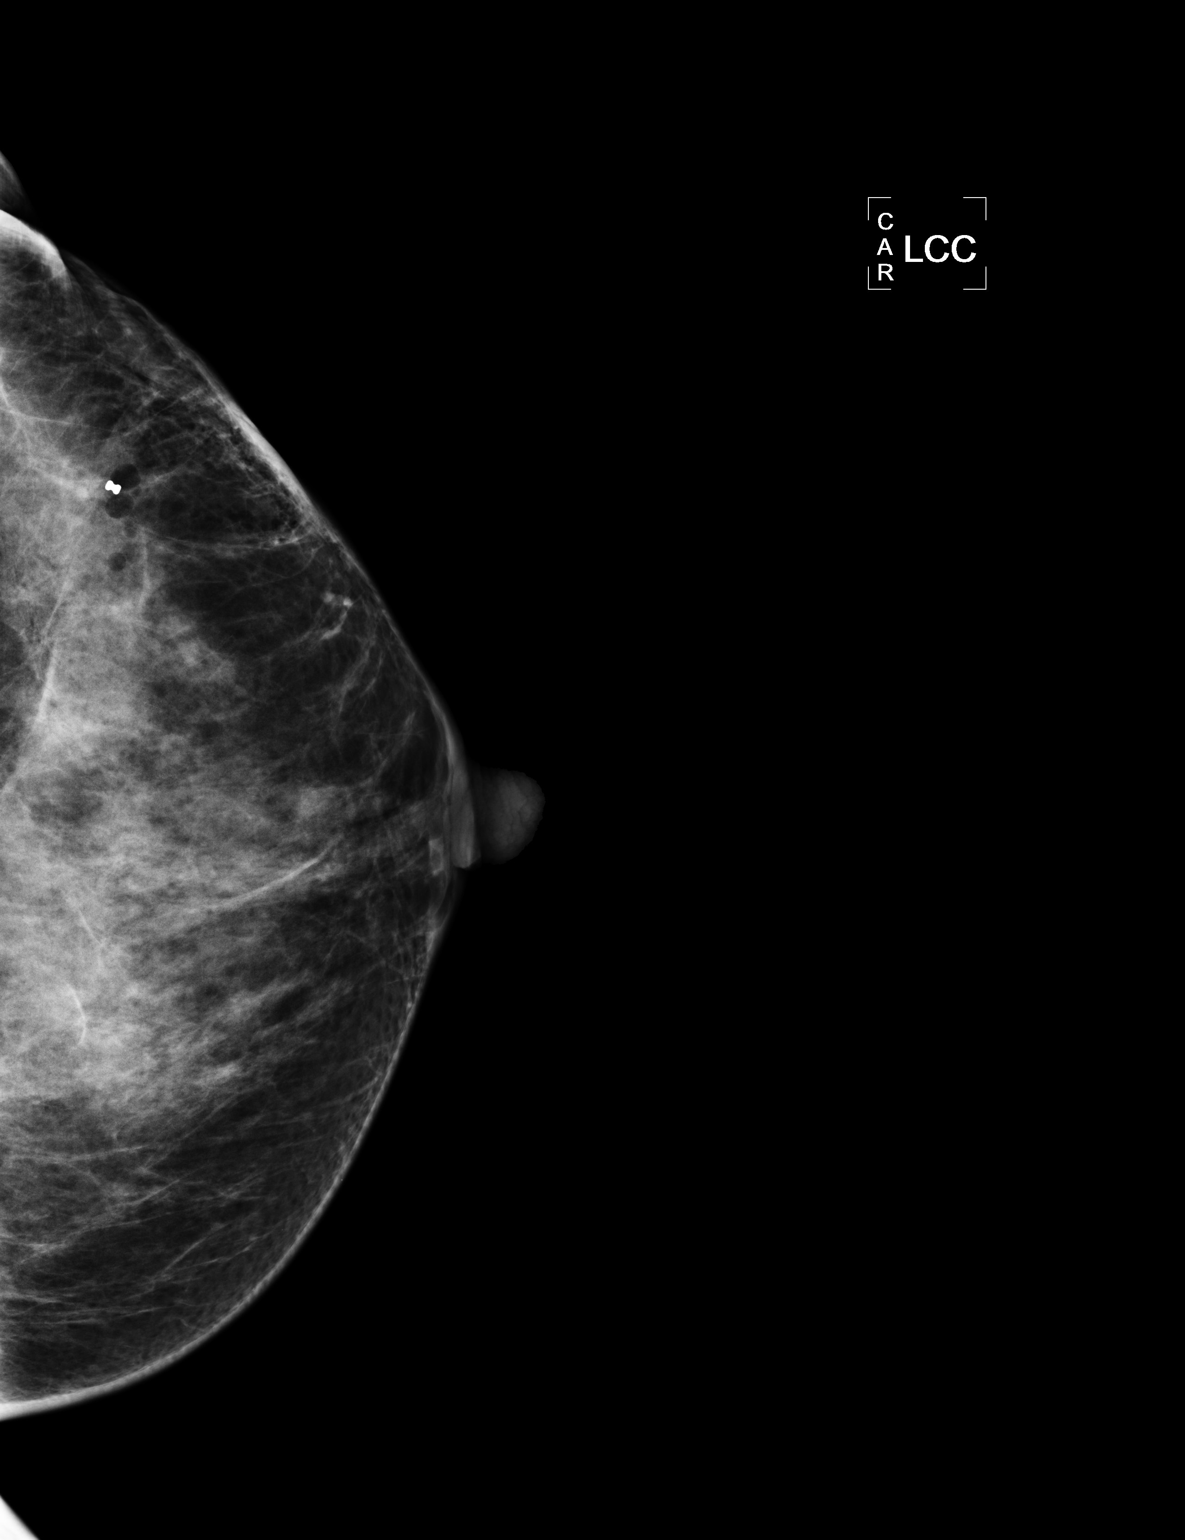

[L ML]
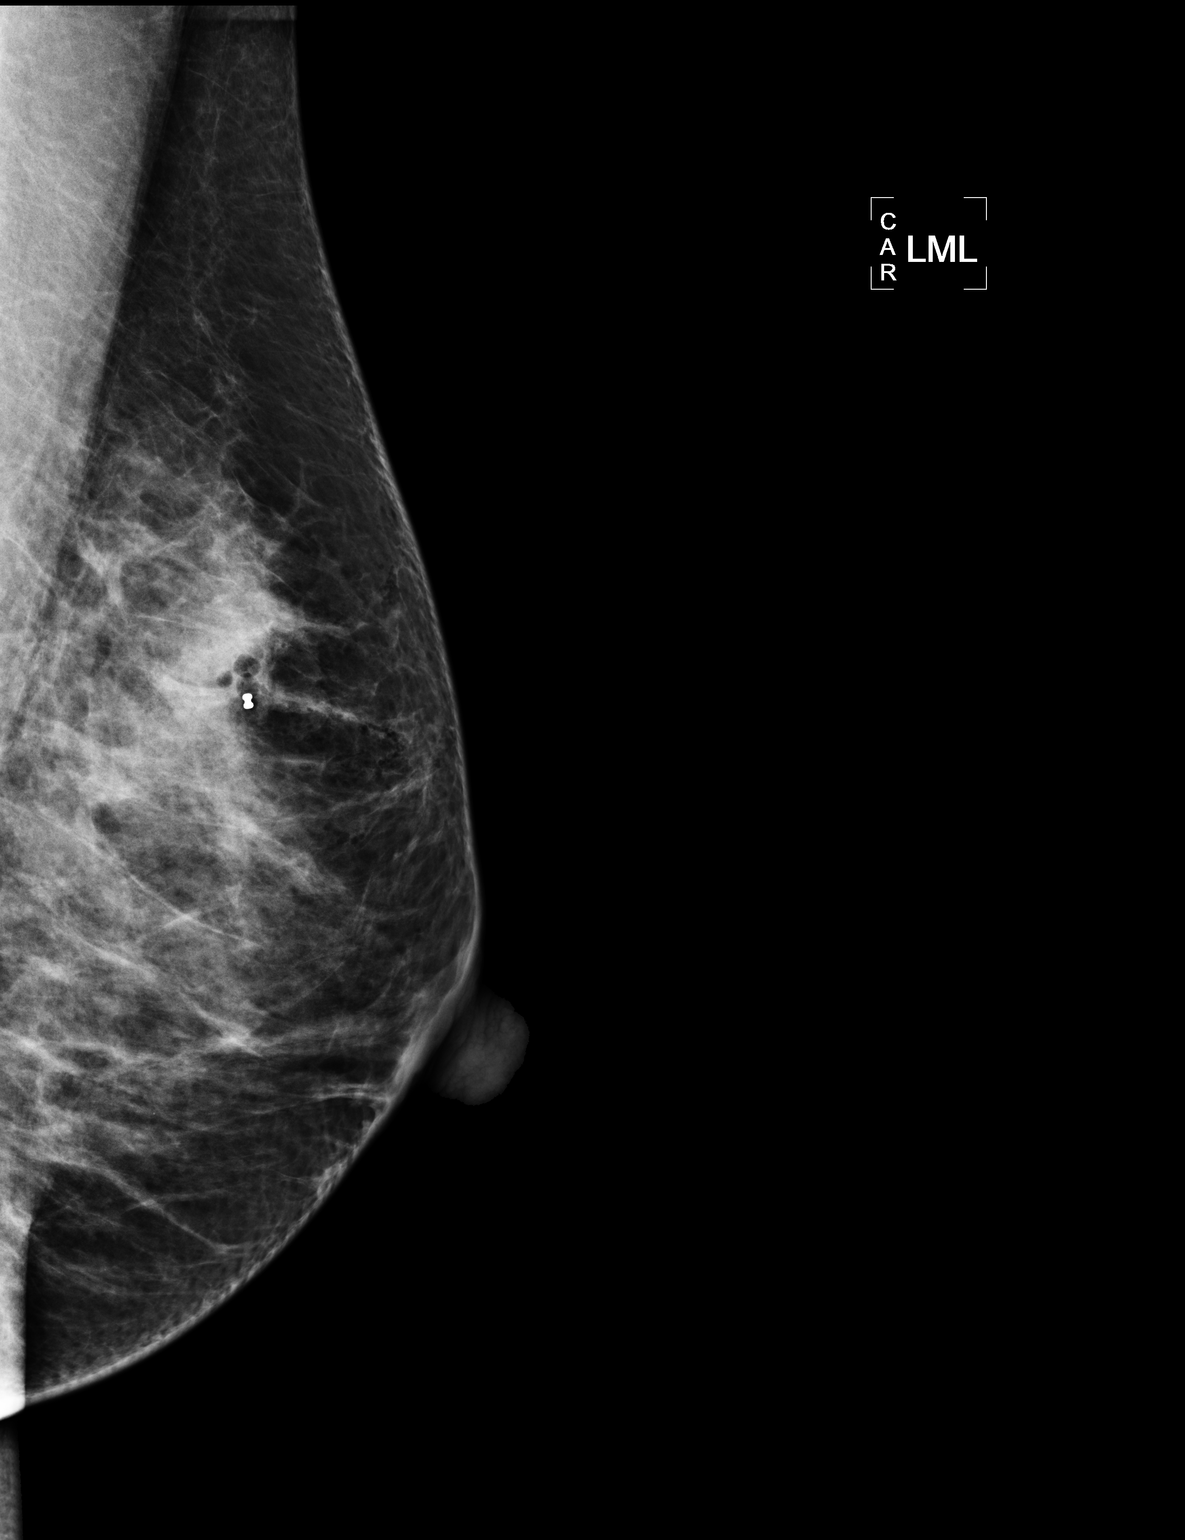

[2 of 2 positions shown; findings below may reference images not displayed]

FINDINGS: Mammographic images were obtained following MRI guided biopsy of the
targeted numbness enhancement in the upper-outer quadrant. Images
demonstrate a dumbbell-shaped metallic clip along the anterior
lateral edge of the biopsy site.
IMPRESSION: Satisfactory clip placement post MRI guided core needle biopsy left
breast.

Final Assessment: Post Procedure Mammograms for Marker Placement

## 2016-03-22 IMAGING — MR MR LT BREAST BX W LOC DEV 1ST LESION IMAGE BX SPEC MR GUIDE
6 of 9 series · 33 of 48 positions shown · IV contrast (multihance)
Comparison: Previous exams.

ADDENDUM:
Pathology revealed fibrocystic changes with adenosis and
calcifications, and pseudoangiomatous stromal hyperplasia (PASH) in
the left breast. This was found to be concordant by Dr. Mory Frisch.
Pathology was discussed with the patient by telephone. She reported
doing well after the biopsy with tenderness at the site. Post biopsy
instructions and care were reviewed and her questions were answered.
She was encouraged to call The [REDACTED]
for any additional concerns. She was asked to return for a bilateral
breast MRI in 6 months.

Pathology results reported by Waaijenberg Utter RN, BSN on February 20, 2014.
CLINICAL DATA: Recent high risk screening MRI demonstrates 2 cm non
mass enhancement over the upper outer quadrant left breast. Family
history of breast cancer in her mother at age 43.
EXAM:
MRI GUIDED CORE NEEDLE BIOPSY OF THE LEFT BREAST
TECHNIQUE: Multiplanar, multisequence MR imaging of the LEFT breast was
performed both before and after administration of intravenous
contrast.
CONTRAST:  14mL MULTIHANCE GADOBENATE DIMEGLUMINE 529 MG/ML IV SOLN

[Series 5: axial pre-cm · axial · non-contrast · 1.3mm · 0.73mm/px · z∈[-99,+87]mm · 6 of 144 slices shown]
[im 1/144]
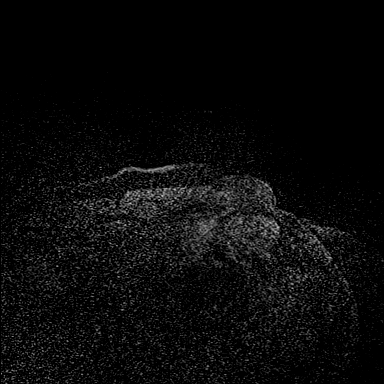
[im 29/144]
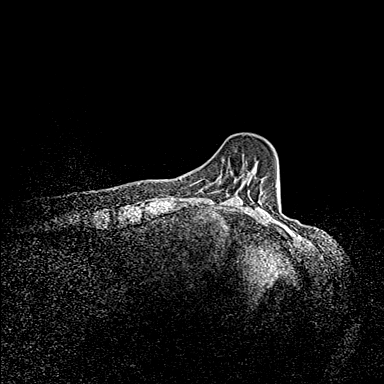
[im 58/144]
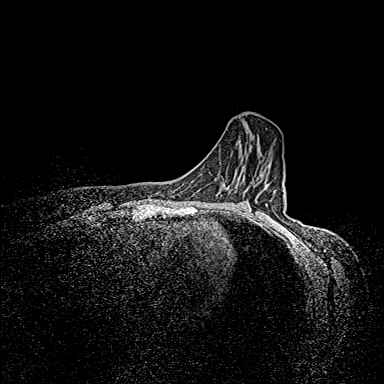
[im 86/144]
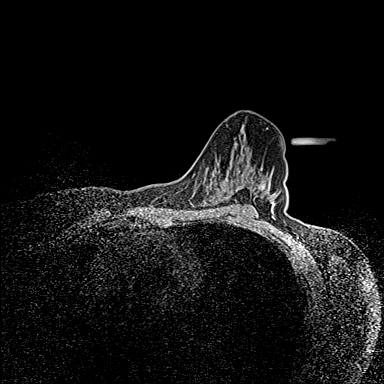
[im 115/144]
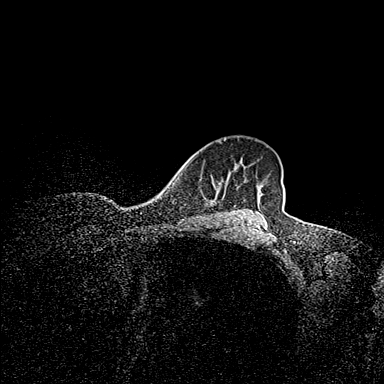
[im 144/144]
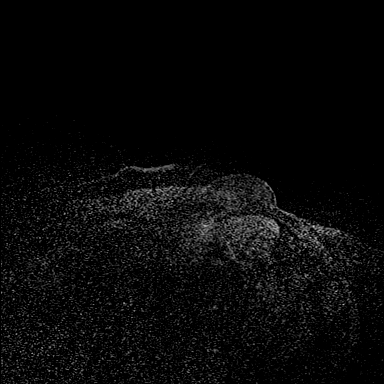

[Series 7: axial post 20 · axial · 1.3mm · 0.73mm/px · z∈[-99,+87]mm · 6 of 144 slices shown (1 of 2)]
[im 1/144]
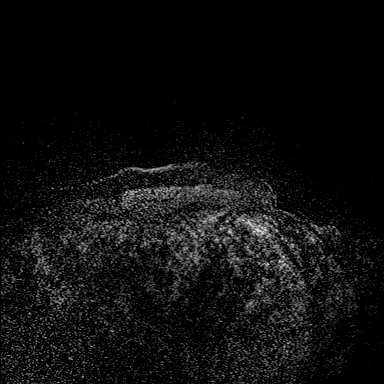
[im 29/144]
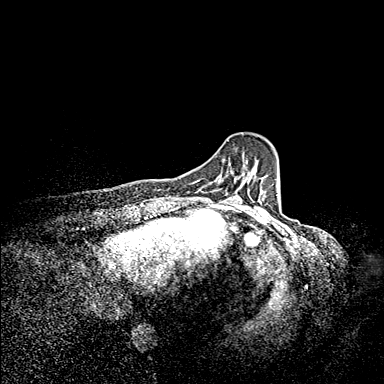
[im 58/144]
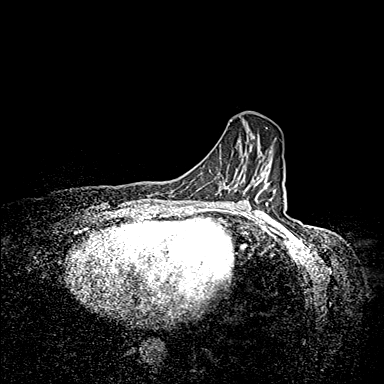
[im 86/144]
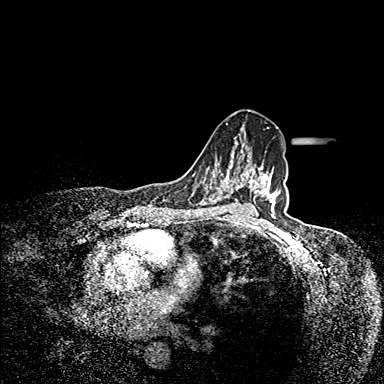
[im 115/144]
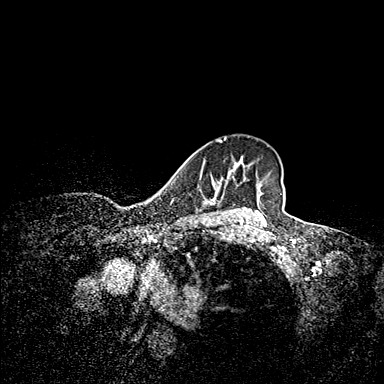
[im 144/144]
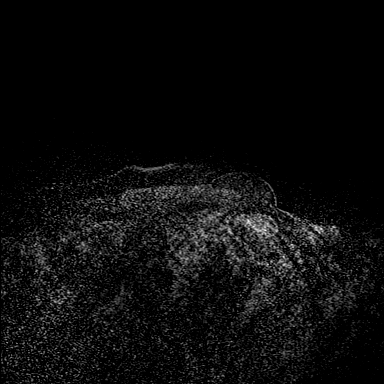

[Series 8: axial post 20 · axial · 1.3mm · 0.73mm/px · z∈[-99,+87]mm · 6 of 144 slices shown (2 of 2)]
[im 1/144]
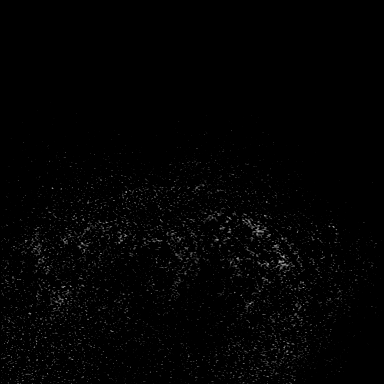
[im 29/144]
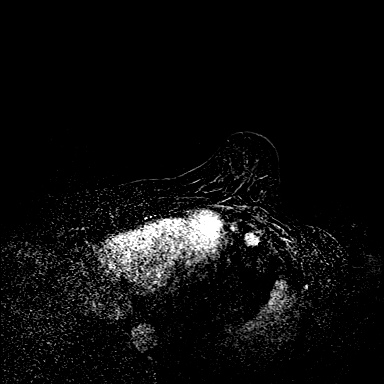
[im 58/144]
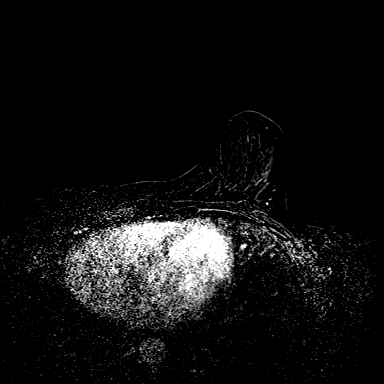
[im 86/144]
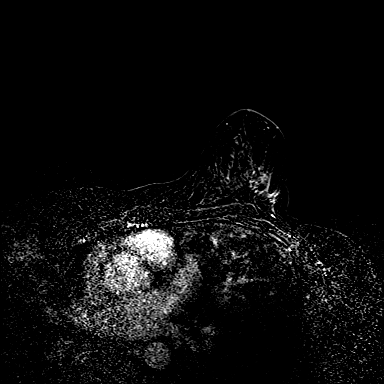
[im 115/144]
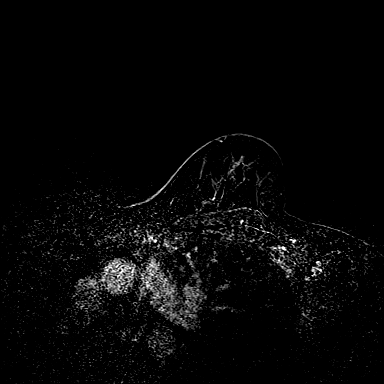
[im 144/144]
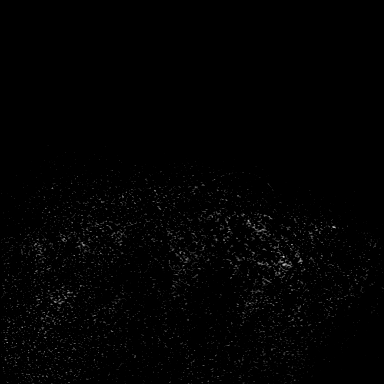

[Series 9: axial post 3 · axial · 1.3mm · 0.73mm/px · z∈[-99,+87]mm · 5 of 144 slices shown (1 of 2)]
[im 1/144]
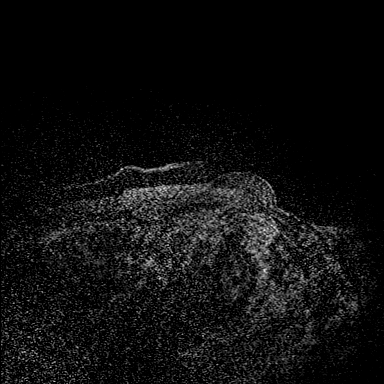
[im 36/144]
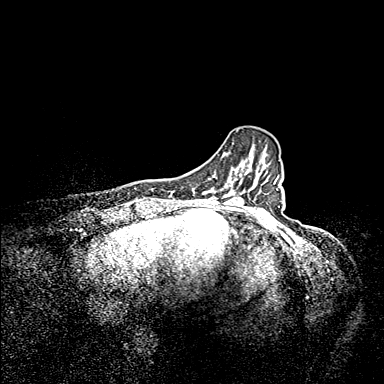
[im 72/144]
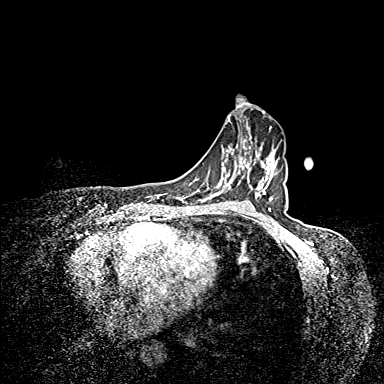
[im 108/144]
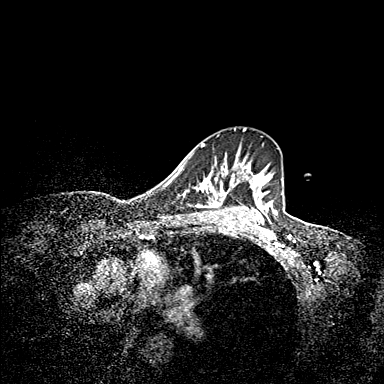
[im 144/144]
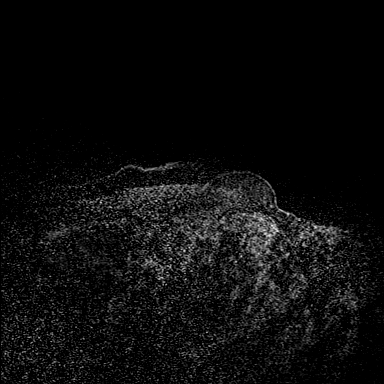

[Series 10: axial post 3 · axial · 1.3mm · 0.73mm/px · z∈[-99,+87]mm · 5 of 144 slices shown (2 of 2)]
[im 1/144]
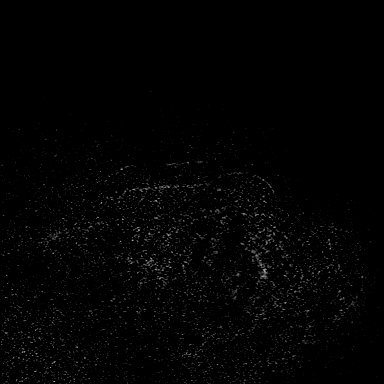
[im 36/144]
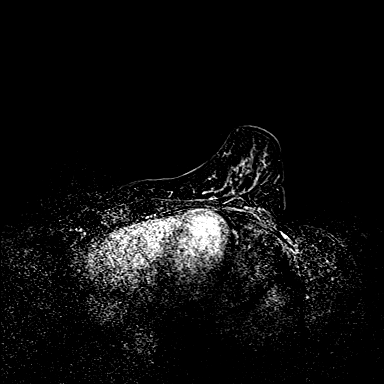
[im 72/144]
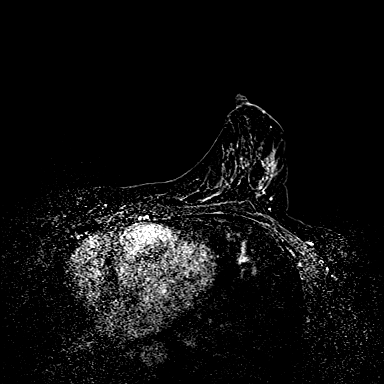
[im 108/144]
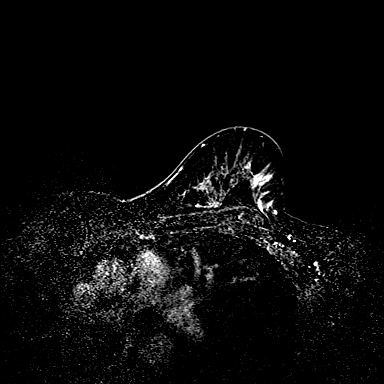
[im 144/144]
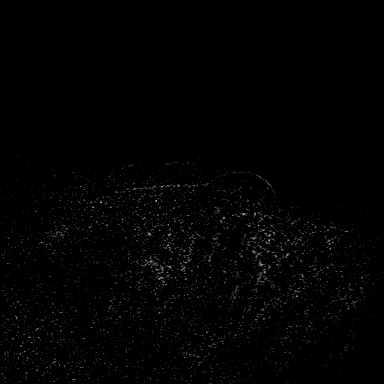

[Series 13: axial confirmation · axial · 1.3mm · 0.73mm/px · z∈[-99,+87]mm · 5 of 144 slices shown]
[im 1/144]
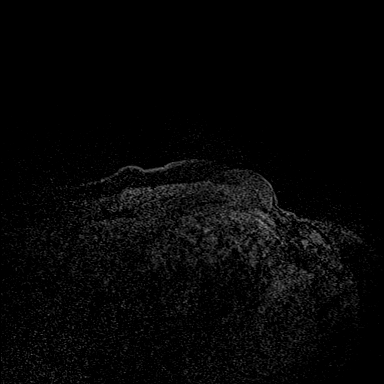
[im 36/144]
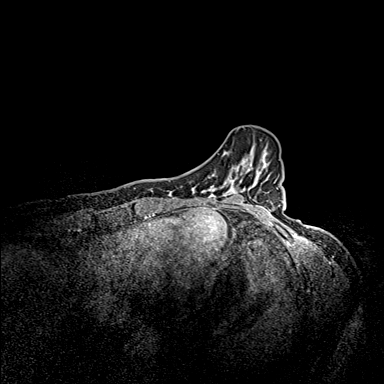
[im 72/144]
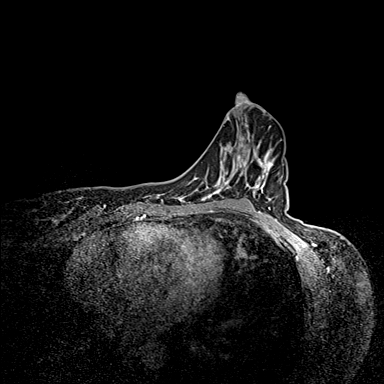
[im 108/144]
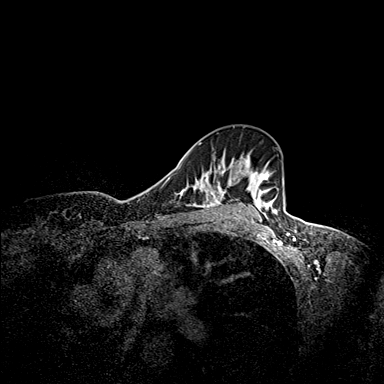
[im 144/144]
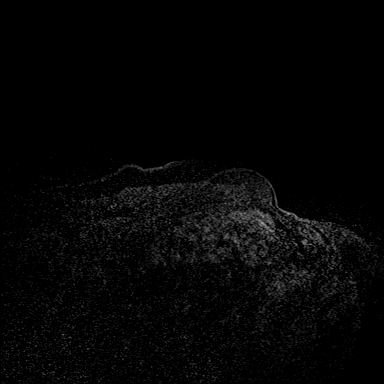

[33 of 48 positions shown; findings below may reference images not displayed]

FINDINGS: I met with the patient, and we discussed the procedure of MRI guided
biopsy, including risks, benefits, and alternatives. Specifically,
we discussed the risks of infection, bleeding, tissue injury, clip
migration, and inadequate sampling. Informed, written consent was
given. The usual time out protocol was performed immediately prior
to the procedure.

Using sterile technique, 2% Lidocaine, MRI guidance, and a 9 gauge
vacuum assisted device, biopsy was performed of the targeted non
mass enhancement over the upper outer quadrant using a lateral to
medial approach. Seven core tissue specimens were obtained as
patient tolerated the procedure without complications. At the
conclusion of the procedure, a dumbbell-shaped tissue marker clip
was deployed into the biopsy cavity. Follow-up 2-view mammogram was
performed and dictated separately.
IMPRESSION: MRI guided biopsy of non mass enhancement upper outer quadrant left
breast. No apparent complications.

## 2016-09-07 ENCOUNTER — Other Ambulatory Visit: Payer: Self-pay | Admitting: Obstetrics and Gynecology

## 2016-09-07 DIAGNOSIS — Z1231 Encounter for screening mammogram for malignant neoplasm of breast: Secondary | ICD-10-CM

## 2016-09-28 ENCOUNTER — Ambulatory Visit
Admission: RE | Admit: 2016-09-28 | Discharge: 2016-09-28 | Disposition: A | Discharge status: Home / Self Care | Payer: Managed Care, Other (non HMO) | Source: Ambulatory Visit | Attending: Obstetrics and Gynecology | Admitting: Obstetrics and Gynecology

## 2016-09-28 DIAGNOSIS — Z1231 Encounter for screening mammogram for malignant neoplasm of breast: Secondary | ICD-10-CM

## 2017-04-19 ENCOUNTER — Encounter: Payer: Self-pay | Admitting: Genetics

## 2020-03-25 ENCOUNTER — Other Ambulatory Visit: Payer: Self-pay | Admitting: Obstetrics and Gynecology

## 2020-03-25 DIAGNOSIS — R928 Other abnormal and inconclusive findings on diagnostic imaging of breast: Secondary | ICD-10-CM

## 2020-04-05 ENCOUNTER — Ambulatory Visit: Payer: Managed Care, Other (non HMO)

## 2020-04-05 ENCOUNTER — Other Ambulatory Visit: Payer: Self-pay

## 2020-04-05 ENCOUNTER — Ambulatory Visit
Admission: RE | Admit: 2020-04-05 | Discharge: 2020-04-05 | Disposition: A | Discharge status: Home / Self Care | Payer: 59 | Source: Ambulatory Visit | Attending: Obstetrics and Gynecology | Admitting: Obstetrics and Gynecology

## 2020-04-05 DIAGNOSIS — R928 Other abnormal and inconclusive findings on diagnostic imaging of breast: Secondary | ICD-10-CM

## 2020-07-12 ENCOUNTER — Other Ambulatory Visit (HOSPITAL_COMMUNITY): Payer: Self-pay | Admitting: Internal Medicine

## 2020-07-12 DIAGNOSIS — R609 Edema, unspecified: Secondary | ICD-10-CM

## 2020-07-15 ENCOUNTER — Other Ambulatory Visit: Payer: Self-pay

## 2020-07-15 ENCOUNTER — Ambulatory Visit (HOSPITAL_COMMUNITY)
Admission: RE | Admit: 2020-07-15 | Discharge: 2020-07-15 | Disposition: A | Discharge status: Home / Self Care | Payer: 59 | Source: Ambulatory Visit | Attending: Internal Medicine | Admitting: Internal Medicine

## 2020-07-15 DIAGNOSIS — R609 Edema, unspecified: Secondary | ICD-10-CM | POA: Diagnosis not present

## 2021-03-19 ENCOUNTER — Other Ambulatory Visit: Payer: Self-pay | Admitting: Obstetrics and Gynecology

## 2021-03-19 DIAGNOSIS — Z1231 Encounter for screening mammogram for malignant neoplasm of breast: Secondary | ICD-10-CM

## 2021-04-07 ENCOUNTER — Ambulatory Visit
Admission: RE | Admit: 2021-04-07 | Discharge: 2021-04-07 | Disposition: A | Discharge status: Home / Self Care | Payer: 59 | Source: Ambulatory Visit | Attending: Obstetrics and Gynecology | Admitting: Obstetrics and Gynecology

## 2021-04-07 DIAGNOSIS — Z1231 Encounter for screening mammogram for malignant neoplasm of breast: Secondary | ICD-10-CM

## 2021-04-09 ENCOUNTER — Other Ambulatory Visit: Payer: Self-pay | Admitting: Obstetrics and Gynecology

## 2021-04-09 DIAGNOSIS — R928 Other abnormal and inconclusive findings on diagnostic imaging of breast: Secondary | ICD-10-CM

## 2021-04-23 ENCOUNTER — Encounter: Payer: Self-pay | Admitting: Physician Assistant

## 2021-04-23 ENCOUNTER — Other Ambulatory Visit: Payer: Self-pay | Admitting: Obstetrics and Gynecology

## 2021-04-23 ENCOUNTER — Ambulatory Visit
Admission: RE | Admit: 2021-04-23 | Discharge: 2021-04-23 | Disposition: A | Discharge status: Home / Self Care | Payer: 59 | Source: Ambulatory Visit | Attending: Obstetrics and Gynecology | Admitting: Obstetrics and Gynecology

## 2021-04-23 DIAGNOSIS — R928 Other abnormal and inconclusive findings on diagnostic imaging of breast: Secondary | ICD-10-CM

## 2021-05-02 ENCOUNTER — Encounter: Payer: Self-pay | Admitting: Physician Assistant

## 2021-05-02 ENCOUNTER — Ambulatory Visit (INDEPENDENT_AMBULATORY_CARE_PROVIDER_SITE_OTHER): Payer: 59 | Admitting: Physician Assistant

## 2021-05-02 VITALS — BP 118/60 | HR 62 | Ht 65.0 in | Wt 197.0 lb

## 2021-05-02 DIAGNOSIS — R194 Change in bowel habit: Secondary | ICD-10-CM | POA: Diagnosis not present

## 2021-05-02 DIAGNOSIS — R635 Abnormal weight gain: Secondary | ICD-10-CM | POA: Diagnosis not present

## 2021-05-02 DIAGNOSIS — R195 Other fecal abnormalities: Secondary | ICD-10-CM | POA: Diagnosis not present

## 2021-05-02 MED ORDER — NA SULFATE-K SULFATE-MG SULF 17.5-3.13-1.6 GM/177ML PO SOLN: 1.0000 | Freq: Once | ORAL | 0 refills | Status: AC

## 2021-05-02 NOTE — Patient Instructions (Signed)
You have been scheduled for a colonoscopy. Please follow written instructions given to you at your visit today.  ?Please pick up your prep supplies at the pharmacy within the next 1-3 days. ?If you use inhalers (even only as needed), please bring them with you on the day of your procedure. ? ?If you are age 48 or older, your body mass index should be between 23-30. Your Body mass index is 32.78 kg/m?Marland Kitchen If this is out of the aforementioned range listed, please consider follow up with your Primary Care Provider. ? ?If you are age 66 or younger, your body mass index should be between 19-25. Your Body mass index is 32.78 kg/m?Marland Kitchen If this is out of the aformentioned range listed, please consider follow up with your Primary Care Provider.  ? ?________________________________________________________ ? ?The [facility] providers would like to encourage you to use [facility] to communicate with providers for non-urgent requests or questions.  Due to long hold times on the telephone, sending your provider a message by [facility] may be a faster and more efficient way to get a response.  Please allow 48 business hours for a response.  Please remember that this is for non-urgent requests.  ?_______________________________________________________ ? ?

## 2021-05-02 NOTE — Progress Notes (Signed)
? ?Chief Complaint: Positive Cologuard and generalized abdominal complaints ? ?HPI: ?   Danielle Jenkins is a 46 year old female with a past medical history as listed below including recently diagnosed hypothyroidism, who was referred to me by Michael Boston, MD for a complaint of positive Cologuard and generalized abdominal complaints. ?   02/12/2021 CMP and CBC normal.  CRP normal.  ANA negative.  Reported positive Cologuard. ?   Today, the patient tells me that for the past 1-1/2 years she has just not felt like herself with over a 30 pound weight gain regardless of what she eats or drinks and brain fog as well as feeling just exhausted.  Also describes various generalized abdominal discomfort just "all over" at various times which is not severe but just "abnormal for me".  Tells me "it could just be gas".  Also describes problems with hemorrhoids which have been recurrent.  Also having occasional stools which are looser than others. ?   Does describe a family history of early menopause for her mother and sister. ?   Denies fever, chills, nausea, vomiting, heartburn, reflux, weight loss, blood in her stool or symptoms that awaken her from sleep. ? ?Past Medical History:  ?Diagnosis Date  ? Hypothyroidism   ? TMJ (dislocation of temporomandibular joint)   ? Vitamin D deficiency   ? ? ?Past Surgical History:  ?Procedure Laterality Date  ? MOUTH SURGERY    ? ? ?Current Outpatient Medications  ?Medication Sig Dispense Refill  ? levothyroxine (SYNTHROID) 25 MCG tablet Take 25 mcg by mouth every morning.    ? Na Sulfate-K Sulfate-Mg Sulf 17.5-3.13-1.6 GM/177ML SOLN Take 1 kit by mouth once for 1 dose. 354 mL 0  ? ?No current facility-administered medications for this visit.  ? ? ?Allergies as of 05/02/2021  ? (No Known Allergies)  ? ? ?Family History  ?Problem Relation Age of Onset  ? Breast cancer Mother 37  ? Breast cancer Paternal Grandmother   ?     71s  ? ? ?Social History  ? ?Socioeconomic History  ? Marital status:  Married  ?  Spouse name: Not on file  ? Number of children: 3  ? Years of education: Not on file  ? Highest education level: Not on file  ?Occupational History  ? Occupation: accountant  ?Tobacco Use  ? Smoking status: Never  ? Smokeless tobacco: Never  ?Vaping Use  ? Vaping Use: Never used  ?Substance and Sexual Activity  ? Alcohol use: Yes  ?  Comment: occ  ? Drug use: No  ? Sexual activity: Yes  ?Other Topics Concern  ? Not on file  ?Social History Narrative  ? Not on file  ? ?Social Determinants of Health  ? ?Financial Resource Strain: Not on file  ?Food Insecurity: Not on file  ?Transportation Needs: Not on file  ?Physical Activity: Not on file  ?Stress: Not on file  ?Social Connections: Not on file  ?Intimate Partner Violence: Not on file  ? ? ?Review of Systems:    ?Constitutional: No weight loss, fever or chills ?Skin: No rash  ?Cardiovascular: No chest pain ?Respiratory: No SOB  ?Gastrointestinal: See HPI and otherwise negative ?Genitourinary: No dysuria  ?Neurological: No headache, dizziness or syncope ?Musculoskeletal: No new muscle or joint pain ?Hematologic: No bleeding  ?Psychiatric: No history of depression or anxiety ? ? Physical Exam:  ?Vital signs: ?BP 118/60   Pulse 62   Ht _0  (1.651 m)   Wt 197 lb (89.4 kg)  SpO2 98%   BMI 32.78 kg/m?   ?Constitutional:   Pleasant overweight Caucasian female appears to be in NAD, Well developed, Well nourished, alert and cooperative ?Head:  Normocephalic and atraumatic. ?Eyes:   PEERL, EOMI. No icterus. Conjunctiva pink. ?Ears:  Normal auditory acuity. ?Neck:  Supple ?Throat: Oral cavity and pharynx without inflammation, swelling or lesion.  ?Respiratory: Respirations even and unlabored. Lungs clear to auscultation bilaterally.   No wheezes, crackles, or rhonchi.  ?Cardiovascular: Normal S1, S2. No MRG. Regular rate and rhythm. No peripheral edema, cyanosis or pallor.  ?Gastrointestinal:  Soft, nondistended, nontender. No rebound or guarding. Normal  bowel sounds. No appreciable masses or hepatomegaly. ?Rectal:  Not performed.  ?Msk:  Symmetrical without gross deformities. Without edema, no deformity or joint abnormality.  ?Neurologic:  Alert and  oriented x4;  grossly normal neurologically.  ?Skin:   Dry and intact without significant lesions or rashes. ?Psychiatric:  Demonstrates good judgement and reason without abnormal affect or behaviors. ? ?See HPI for recent labs. ? ?Assessment: ?1.  Positive Cologuard ?2.  Generalized abdominal discomfort: With a change in bowel habits, increase in gas and abdominal discomfort at various times, along with this just feels exhausted with a 30 pound weight gain over the past year and a half, reports early menopause in her family; consider early menopause most likely ? ?Plan: ?1.  Scheduled patient for a diagnostic colonoscopy in the [place] with Dr. Bryan Lemma as he had sooner availability. Patient is appropriate for endoscopic procedure(s) in the ambulatory ([facility]) setting.  Provided the patient a detailed list of risks for the procedure and she agrees to proceed.  Patient will continue to follow Dr. Bryan Lemma as her primary GI physician here. ?2.  Discussed with patient the possibility of early menopause given all of her generalized complaints and a family history of the same.  She is going to see her gynecologist in a couple of weeks.  Told her to discuss further with them. ?3.  Patient follow in clinic per recommendations from Dr. Bryan Lemma after time of procedure. ? ? ?Ellouise Newer, PA-C ?[facility] ?05/02/2021, 11:26 AM ? ?Cc: Michael Boston, MD  ?

## 2021-05-07 ENCOUNTER — Encounter: Payer: Self-pay | Admitting: Gastroenterology

## 2021-05-12 ENCOUNTER — Ambulatory Visit (AMBULATORY_SURGERY_CENTER): Payer: 59 | Admitting: Gastroenterology

## 2021-05-12 ENCOUNTER — Encounter: Payer: Self-pay | Admitting: Gastroenterology

## 2021-05-12 VITALS — BP 100/64 | HR 71 | Temp 98.0°F | Resp 15 | Ht 65.0 in | Wt 197.0 lb

## 2021-05-12 DIAGNOSIS — D122 Benign neoplasm of ascending colon: Secondary | ICD-10-CM

## 2021-05-12 DIAGNOSIS — K635 Polyp of colon: Secondary | ICD-10-CM

## 2021-05-12 DIAGNOSIS — R195 Other fecal abnormalities: Secondary | ICD-10-CM

## 2021-05-12 DIAGNOSIS — Z1211 Encounter for screening for malignant neoplasm of colon: Secondary | ICD-10-CM

## 2021-05-12 MED ORDER — SODIUM CHLORIDE 0.9 % IV SOLN: 500.0000 mL | Freq: Once | INTRAVENOUS | Status: DC

## 2021-05-12 NOTE — Progress Notes (Signed)
Pt's states no medical or surgical changes since previsit or office visit. 

## 2021-05-12 NOTE — Op Note (Signed)
[place] ?Patient Name: Danielle Jenkins ?Procedure Date: 05/12/2021 9:47 AM ?MRN: [id number] ?Endoscopist: Gerrit Heck , MD ?Age: 46 ?Referring MD:  ?Date of Birth: 02-12-1975 ?Gender: Female ?Account #: [id number] ?Procedure:                Colonoscopy ?Indications:              Screening for colorectal malignant neoplasm, This  ?                          is the patient's first colonoscopy. ?                          Recent positive Cologuard test. ?Medicines:                Monitored Anesthesia Care ?Procedure:                Pre-Anesthesia Assessment: ?                          - Prior to the procedure, a History and Physical  ?                          was performed, and patient medications and  ?                          allergies were reviewed. The patient's tolerance of  ?                          previous anesthesia was also reviewed. The risks  ?                          and benefits of the procedure and the sedation  ?                          options and risks were discussed with the patient.  ?                          All questions were answered, and informed consent  ?                          was obtained. Prior Anticoagulants: The patient has  ?                          taken no previous anticoagulant or antiplatelet  ?                          agents. ASA Grade Assessment: II - A patient with  ?                          mild systemic disease. After reviewing the risks  ?                          and benefits, the patient was deemed in  ?  satisfactory condition to undergo the procedure. ?                          After obtaining informed consent, the colonoscope  ?                          was passed under direct vision. Throughout the  ?                          procedure, the patient's blood pressure, pulse, and  ?                          oxygen saturations were monitored continuously. The  ?                          PCF-HQ190L Colonoscope was introduced  through the  ?                          anus and advanced to the the terminal ileum. The  ?                          colonoscopy was performed without difficulty. The  ?                          patient tolerated the procedure well. The quality  ?                          of the bowel preparation was excellent. The  ?                          terminal ileum, ileocecal valve, appendiceal  ?                          orifice, and rectum were photographed. ?Scope In: 9:57:51 AM ?Scope Out: 10:13:21 AM ?Scope Withdrawal Time: 0 hours 11 minutes 44 seconds  ?Total Procedure Duration: 0 hours 15 minutes 30 seconds  ?Findings:                 The perianal and digital rectal examinations were  ?                          normal. ?                          A 6 mm polyp was found in the ascending colon. The  ?                          polyp was sessile. The polyp was removed with a  ?                          cold snare. Resection and retrieval were complete.  ?                          Estimated blood loss was minimal. ?  The exam was otherwise normal throughout the  ?                          remainder of the colon. ?                          The retroflexed view of the distal rectum and anal  ?                          verge was normal and showed no anal or rectal  ?                          abnormalities. ?                          The terminal ileum appeared normal. ?Complications:            No immediate complications. ?Estimated Blood Loss:     Estimated blood loss was minimal. ?Impression:               - One 6 mm polyp in the ascending colon, removed  ?                          with a cold snare. Resected and retrieved. ?                          - The distal rectum and anal verge are normal on  ?                          retroflexion view. ?                          - The examined portion of the ileum was normal. ?Recommendation:           - Patient has a contact number available for  ?                           emergencies. The signs and symptoms of potential  ?                          delayed complications were discussed with the  ?                          patient. Return to normal activities tomorrow.  ?                          Written discharge instructions were provided to the  ?                          patient. ?                          - Resume previous diet. ?                          - Continue present medications. ?                          -  Await pathology results. ?                          - Repeat colonoscopy for surveillance based on  ?                          pathology results. ?                          - Return to GI office PRN. ?Gerrit Heck, MD ?05/12/2021 10:17:58 AM ?

## 2021-05-12 NOTE — Progress Notes (Signed)
Vss nad trans to pacu ?

## 2021-05-12 NOTE — Progress Notes (Signed)
? ?GASTROENTEROLOGY PROCEDURE H&P NOTE  ? ?Primary Care Physician: ?Louretta Shorten, MD ? ? ? ?Reason for Procedure:  Colon Cancer screening ? ?Plan:    Colonoscopy ? ?Patient is appropriate for endoscopic procedure(s) in the ambulatory ([facility]) setting. ? ?The nature of the procedure, as well as the risks, benefits, and alternatives were carefully and thoroughly reviewed with the patient. Ample time for discussion and questions allowed. The patient understood, was satisfied, and agreed to proceed.  ? ? ? ?HPI: ?Danielle Jenkins is a 46 y.o. female who presents for colonoscopy for routine Colon Cancer screening.  Recent Cologuard positive. No known family history of colon cancer or related malignancy. Does have generalized abdominal discomfort and intermittently symptomatic hemorrhoids.  ? ?Past Medical History:  ?Diagnosis Date  ? Hypothyroidism   ? TMJ (dislocation of temporomandibular joint)   ? Vitamin D deficiency   ? ? ?Past Surgical History:  ?Procedure Laterality Date  ? MOUTH SURGERY    ? ? ?Prior to Admission medications   ?Medication Sig Start Date End Date Taking? Authorizing Provider  ?ibuprofen (ADVIL) 200 MG tablet Take 200 mg by mouth 3 times/day as needed-between meals & bedtime.   Yes [provider]  ?levothyroxine (SYNTHROID) 25 MCG tablet Take 25 mcg by mouth every morning. 03/24/21  Yes [provider]  ? ? ?Current Outpatient Medications  ?Medication Sig Dispense Refill  ? ibuprofen (ADVIL) 200 MG tablet Take 200 mg by mouth 3 times/day as needed-between meals & bedtime.    ? levothyroxine (SYNTHROID) 25 MCG tablet Take 25 mcg by mouth every morning.    ? ?Current Facility-Administered Medications  ?Medication Dose Route Frequency Provider Last Rate Last Admin  ? 0.9 %  sodium chloride infusion  500 mL Intravenous Once Vernadette Stutsman V, DO      ? ? ?Allergies as of 05/12/2021  ? (No Known Allergies)  ? ? ?Family History  ?Problem Relation Age of Onset  ? Breast cancer Mother 16  ?  Breast cancer Paternal Grandmother   ?     95s  ? Colon cancer Neg Hx   ? Prostate cancer Neg Hx   ? Rectal cancer Neg Hx   ? Esophageal cancer Neg Hx   ? ? ?Social History  ? ?Socioeconomic History  ? Marital status: Married  ?  Spouse name: Not on file  ? Number of children: 3  ? Years of education: Not on file  ? Highest education level: Not on file  ?Occupational History  ? Occupation: accountant  ?Tobacco Use  ? Smoking status: Never  ? Smokeless tobacco: Never  ?Vaping Use  ? Vaping Use: Never used  ?Substance and Sexual Activity  ? Alcohol use: Yes  ?  Comment: occ  ? Drug use: No  ? Sexual activity: Yes  ?Other Topics Concern  ? Not on file  ?Social History Narrative  ? Not on file  ? ?Social Determinants of Health  ? ?Financial Resource Strain: Not on file  ?Food Insecurity: Not on file  ?Transportation Needs: Not on file  ?Physical Activity: Not on file  ?Stress: Not on file  ?Social Connections: Not on file  ?Intimate Partner Violence: Not on file  ? ? ?Physical Exam: ?Vital signs in last 24 hours: ?@BP  131/73   Pulse 71   Temp 98 ?F (36.7 ?C)   Ht 5' 5"  (1.651 m)   Wt 197 lb (89.4 kg)   SpO2 99%   BMI 32.78 kg/m?  ?GEN: NAD ?EYE:  Sclerae anicteric ?ENT: MMM ?CV: Non-tachycardic ?Pulm: CTA b/l ?GI: Soft, NT/ND ?NEURO:  Alert & Oriented x 3 ? ? ?Gerrit Heck, DO ?[facility] ? ? ?05/12/2021 9:48 AM ? ?

## 2021-05-12 NOTE — Progress Notes (Deleted)
Called to room to assist during endoscopic procedure.  Patient ID and intended procedure confirmed with present staff. Received instructions for my participation in the procedure from the performing physician.  

## 2021-05-12 NOTE — Progress Notes (Signed)
Called to room to assist during endoscopic procedure.  Patient ID and intended procedure confirmed with present staff. Received instructions for my participation in the procedure from the performing physician.  

## 2021-05-12 NOTE — Progress Notes (Signed)
Agree with the assessment and plan as outlined by Ellouise Newer, PA-C. ? ?Zona Pedro, DO, FACG ? ?

## 2021-05-12 NOTE — Patient Instructions (Signed)
Handouts given to patient on hemorrhoids, polyps, and diverticulosis. ?Await pathology results. ?Resume previous diet and continue present medications. ?Repeat colonoscopy for surveillance will be determined based off of pathology results. ?HAPPY BIRTHDAY!!!! :) ? ?YOU HAD AN ENDOSCOPIC PROCEDURE TODAY AT [facility]:   Refer to the procedure report that was given to you for any specific questions about what was found during the examination.  If the procedure report does not answer your questions, please call your gastroenterologist to clarify.  If you requested that your care partner not be given the details of your procedure findings, then the procedure report has been included in a sealed envelope for you to review at your convenience later. ? ?YOU SHOULD EXPECT: Some feelings of bloating in the abdomen. Passage of more gas than usual.  Walking can help get rid of the air that was put into your GI tract during the procedure and reduce the bloating. If you had a lower endoscopy (such as a colonoscopy or flexible sigmoidoscopy) you may notice spotting of blood in your stool or on the toilet paper. If you underwent a bowel prep for your procedure, you may not have a normal bowel movement for a few days. ? ?Please Note:  You might notice some irritation and congestion in your nose or some drainage.  This is from the oxygen used during your procedure.  There is no need for concern and it should clear up in a day or so. ? ?SYMPTOMS TO REPORT IMMEDIATELY: ? ?Following lower endoscopy (colonoscopy or flexible sigmoidoscopy): ? Excessive amounts of blood in the stool ? Significant tenderness or worsening of abdominal pains ? Swelling of the abdomen that is new, acute ? Fever of 100?F or higher ? ?Following upper endoscopy (EGD) ? Vomiting of blood or coffee ground material ? New chest pain or pain under the shoulder blades ? Painful or persistently difficult swallowing ? New shortness of breath ? Fever of  100?F or higher ? Black, tarry-looking stools ? ?For urgent or emergent issues, a gastroenterologist can be reached at any hour by calling [phone]. ?Do not use MyChart messaging for urgent concerns.  ? ? ?DIET:  We do recommend a small meal at first, but then you may proceed to your regular diet.  Drink plenty of fluids but you should avoid alcoholic beverages for 24 hours. ? ?ACTIVITY:  You should plan to take it easy for the rest of today and you should NOT DRIVE or use heavy machinery until tomorrow (because of the sedation medicines used during the test).   ? ?FOLLOW UP: ?Our staff will call the number listed on your records 48-72 hours following your procedure to check on you and address any questions or concerns that you may have regarding the information given to you following your procedure. If we do not reach you, we will leave a message.  We will attempt to reach you two times.  During this call, we will ask if you have developed any symptoms of COVID 19. If you develop any symptoms (ie: fever, flu-like symptoms, shortness of breath, cough etc.) before then, please call [phone].  If you test positive for Covid 19 in the 2 weeks post procedure, please call and report this information to [place].   ? ?If any biopsies were taken you will be contacted by phone or by letter within the next 1-3 weeks.  Please call us at [phone] if you have not heard about the biopsies in 3 weeks.  ? ? ?  SIGNATURES/CONFIDENTIALITY: ?You and/or your care partner have signed paperwork which will be entered into your electronic medical record.  These signatures attest to the fact that that the information above on your After Visit Summary has been reviewed and is understood.  Full responsibility of the confidentiality of this discharge information lies with you and/or your care-partner.  ?

## 2021-05-14 ENCOUNTER — Telehealth: Payer: Self-pay

## 2021-05-14 NOTE — Telephone Encounter (Signed)
?  Follow up Call- ? ? ?  05/12/2021  ?  9:08 AM  ?Call back number  ?Post procedure Call Back phone  # [phone]  ?Permission to leave phone message Yes  ?  ? ?Patient questions: ? ?Do you have a fever, pain , or abdominal swelling? No. ?Pain Score  0 * ? ?Have you tolerated food without any problems? Yes.   ? ?Have you been able to return to your normal activities? Yes.   ? ?Do you have any questions about your discharge instructions: ?Diet   No. ?Medications  No. ?Follow up visit  No. ? ?Do you have questions or concerns about your Care? No. ? ?Actions: ?* If pain score is 4 or above: ?No action needed, pain <4. ? ? ?

## 2021-05-23 ENCOUNTER — Encounter: Payer: Self-pay | Admitting: Gastroenterology

## 2021-10-27 ENCOUNTER — Other Ambulatory Visit: Payer: Self-pay | Admitting: Obstetrics and Gynecology

## 2021-10-27 ENCOUNTER — Ambulatory Visit
Admission: RE | Admit: 2021-10-27 | Discharge: 2021-10-27 | Disposition: A | Discharge status: Home / Self Care | Payer: 59 | Source: Ambulatory Visit | Attending: Obstetrics and Gynecology | Admitting: Obstetrics and Gynecology

## 2021-10-27 DIAGNOSIS — R928 Other abnormal and inconclusive findings on diagnostic imaging of breast: Secondary | ICD-10-CM

## 2021-10-27 DIAGNOSIS — N6320 Unspecified lump in the left breast, unspecified quadrant: Secondary | ICD-10-CM

## 2022-04-27 ENCOUNTER — Ambulatory Visit
Admission: RE | Admit: 2022-04-27 | Discharge: 2022-04-27 | Disposition: A | Discharge status: Home / Self Care | Payer: 59 | Source: Ambulatory Visit | Attending: Obstetrics and Gynecology | Admitting: Obstetrics and Gynecology

## 2022-04-27 DIAGNOSIS — N6320 Unspecified lump in the left breast, unspecified quadrant: Secondary | ICD-10-CM

## 2022-04-27 DIAGNOSIS — R928 Other abnormal and inconclusive findings on diagnostic imaging of breast: Secondary | ICD-10-CM

## 2023-04-19 ENCOUNTER — Other Ambulatory Visit: Payer: Self-pay | Admitting: Obstetrics and Gynecology

## 2023-04-19 DIAGNOSIS — N6320 Unspecified lump in the left breast, unspecified quadrant: Secondary | ICD-10-CM

## 2023-05-18 ENCOUNTER — Ambulatory Visit
Admission: RE | Admit: 2023-05-18 | Discharge: 2023-05-18 | Disposition: A | Discharge status: Home / Self Care | Source: Ambulatory Visit | Attending: Obstetrics and Gynecology | Admitting: Obstetrics and Gynecology

## 2023-05-18 DIAGNOSIS — N6320 Unspecified lump in the left breast, unspecified quadrant: Secondary | ICD-10-CM

## 2023-07-15 ENCOUNTER — Encounter: Admitting: Radiology

## 2023-08-18 ENCOUNTER — Encounter: Payer: Self-pay | Admitting: Radiology

## 2023-08-18 ENCOUNTER — Ambulatory Visit (INDEPENDENT_AMBULATORY_CARE_PROVIDER_SITE_OTHER): Admitting: Radiology

## 2023-08-18 VITALS — BP 114/76 | HR 82 | Ht 64.0 in | Wt 159.0 lb

## 2023-08-18 DIAGNOSIS — N951 Menopausal and female climacteric states: Secondary | ICD-10-CM | POA: Diagnosis not present

## 2023-08-18 MED ORDER — PROGESTERONE MICRONIZED 100 MG PO CAPS: 100.0000 mg | ORAL_CAPSULE | Freq: Every day | ORAL | 1 refills | Status: DC

## 2023-08-18 MED ORDER — ESTRADIOL 0.05 MG/24HR TD PTTW: 1.0000 | MEDICATED_PATCH | TRANSDERMAL | 1 refills | Status: DC

## 2023-08-18 NOTE — Progress Notes (Signed)
   DOREAN HIEBERT March 27, 1975 [id number]   History:  48 y.o. IVORY presents as a new patient for consult regarding HRT. Consult to discuss menopause symptoms, brain fog, severe fatigue, heat issues, anxiety, weight gain, swelling in lower legs, trouble sleeping. LMP: 08/08/23 very light, PMP: 06/2023, no normal period in years (has Mirena). Has been on Zepbound and lost 45lbs in 7months, has gone to 10mg . She was switched to wegovy this week due to insurance changes, has had GI upset, nausea and vomiting on 2.4mg .   Gynecologic History Patient's last menstrual period was 08/08/2023 (exact date). Period Cycle (Days):  (irregular) Period Pattern: (!) Irregular Menstrual Flow: Light Menstrual Control: Tampon Dysmenorrhea: (!) Mild Dysmenorrhea Symptoms: Cramping Contraception/Family planning: IUD Sexually active:yes Last Pap: 4/23. Results were: normal Last mammogram: 05/18/23 normal TBC  Obstetric History OB History  Gravida Para Term Preterm AB Living  3 3    3   SAB IAB Ectopic Multiple Live Births          # Outcome Date GA Lbr Len/2nd Weight Sex Type Anes PTL Lv  3 Para           2 Para           1 Para              The following portions of the patient's history were reviewed and updated as appropriate: allergies, current medications, past family history, past medical history, past social history, past surgical history, and problem list.  Review of Systems  All other systems reviewed and are negative.   Past medical history, past surgical history, family history and social history were all reviewed and documented in the EPIC chart.  Exam:  Vitals:   08/18/23 1121  BP: 114/76  Pulse: 82  SpO2: 99%  Weight: 159 lb (72.1 kg)  Height: 5' 4 (1.626 m)   Body mass index is 27.29 kg/m.  Physical Exam Constitutional:      Appearance: Normal appearance.  Pulmonary:     Effort: Pulmonary effort is normal.  Neurological:     Mental Status: She is alert.  Psychiatric:         Mood and Affect: Mood normal.        Thought Content: Thought content normal.        Judgment: Judgment normal.      Darice Hoit, CMA present for exam  Assessment/Plan:   1. Perimenopausal symptoms (Primary) Discussed risks and benefits. Has IUD as well for endometrial protection, will leave in place.  - estradiol  (VIVELLE -DOT) 0.05 MG/24HR patch; Place 1 patch (0.05 mg total) onto the skin 2 (two) times a week.  Dispense: 24 patch; Refill: 1 - progesterone  (PROMETRIUM ) 100 MG capsule; Take 1 capsule (100 mg total) by mouth daily.  Dispense: 90 capsule; Refill: 1      Return in about 3 months (around 11/18/2023) for Annual, Med Follow-up.  GINETTE COZIER B WHNP-BC 11:46 AM 08/18/2023

## 2023-11-18 ENCOUNTER — Ambulatory Visit (INDEPENDENT_AMBULATORY_CARE_PROVIDER_SITE_OTHER): Admitting: Radiology

## 2023-11-18 ENCOUNTER — Encounter: Payer: Self-pay | Admitting: Radiology

## 2023-11-18 VITALS — BP 118/78 | HR 71 | Ht 65.5 in | Wt 146.0 lb

## 2023-11-18 DIAGNOSIS — N951 Menopausal and female climacteric states: Secondary | ICD-10-CM

## 2023-11-18 DIAGNOSIS — Z975 Presence of (intrauterine) contraceptive device: Secondary | ICD-10-CM

## 2023-11-18 DIAGNOSIS — Z1331 Encounter for screening for depression: Secondary | ICD-10-CM | POA: Diagnosis not present

## 2023-11-18 DIAGNOSIS — Z01419 Encounter for gynecological examination (general) (routine) without abnormal findings: Secondary | ICD-10-CM | POA: Diagnosis not present

## 2023-11-18 DIAGNOSIS — Z6832 Body mass index (BMI) 32.0-32.9, adult: Secondary | ICD-10-CM

## 2023-11-18 DIAGNOSIS — E6609 Other obesity due to excess calories: Secondary | ICD-10-CM | POA: Diagnosis not present

## 2023-11-18 DIAGNOSIS — E66811 Obesity, class 1: Secondary | ICD-10-CM

## 2023-11-18 MED ORDER — PROGESTERONE MICRONIZED 100 MG PO CAPS: 100.0000 mg | ORAL_CAPSULE | Freq: Every evening | ORAL | 4 refills | Status: AC

## 2023-11-18 MED ORDER — ESTRADIOL 0.05 MG/24HR TD PTTW: 1.0000 | MEDICATED_PATCH | TRANSDERMAL | 4 refills | Status: DC

## 2023-11-18 MED ORDER — WEGOVY 2.4 MG/0.75ML ~~LOC~~ SOAJ: 2.4000 mg | SUBCUTANEOUS | 3 refills | Status: DC

## 2023-11-18 NOTE — Progress Notes (Signed)
 Danielle Jenkins Jul 20, 1975 [id number]   History:  48 y.o. IVORY presents for annual exam. Doing well on HRT and wegovy, needs refills. 10lbs from goal weight then will move to maintenance dosing.  Gynecologic History No LMP recorded. (Menstrual status: Irregular Periods).   Contraception/Family planning: IUD Sexually active: yes Last Pap: 2023. Results were: normal Last mammogram: 05/18/23. Results were: normal  Obstetric History OB History  Gravida Para Term Preterm AB Living  3 3 3   3   SAB IAB Ectopic Multiple Live Births          # Outcome Date GA Lbr Len/2nd Weight Sex Type Anes PTL Lv  3 Term           2 Term           1 Term                11/18/2023    8:15 AM  Depression screen PHQ 2/9  Decreased Interest 0  Down, Depressed, Hopeless 0  PHQ - 2 Score 0     The following portions of the patient's history were reviewed and updated as appropriate: allergies, current medications, past family history, past medical history, past social history, past surgical history, and problem list.  Review of Systems  All other systems reviewed and are negative.   Past medical history, past surgical history, family history and social history were all reviewed and documented in the EPIC chart.  Exam:  Vitals:   11/18/23 0810  BP: 118/78  Pulse: 71  SpO2: 97%  Weight: 146 lb (66.2 kg)  Height: 5' 5.5 (1.664 m)   Body mass index is 23.93 kg/m.  Physical Exam Vitals and nursing note reviewed. Exam conducted with a chaperone present.  Constitutional:      Appearance: Normal appearance. She is normal weight.  HENT:     Head: Normocephalic and atraumatic.  Neck:     Thyroid: No thyroid mass, thyromegaly or thyroid tenderness.  Cardiovascular:     Rate and Rhythm: Regular rhythm.     Heart sounds: Normal heart sounds.  Pulmonary:     Effort: Pulmonary effort is normal.     Breath sounds: Normal breath sounds.  Chest:  Breasts:    Breasts are symmetrical.     Right:  Normal. No inverted nipple, mass, nipple discharge, skin change or tenderness.     Left: Normal. No inverted nipple, mass, nipple discharge, skin change or tenderness.  Abdominal:     General: Abdomen is flat. Bowel sounds are normal.     Palpations: Abdomen is soft.  Genitourinary:    General: Normal vulva.     Vagina: Normal. No vaginal discharge, bleeding or lesions.     Cervix: Normal. No discharge or lesion.     Uterus: Normal. Not enlarged and not tender.      Adnexa: Right adnexa normal and left adnexa normal.       Right: No mass, tenderness or fullness.         Left: No mass, tenderness or fullness.    Lymphadenopathy:     Upper Body:     Right upper body: No axillary adenopathy.     Left upper body: No axillary adenopathy.  Skin:    General: Skin is warm and dry.  Neurological:     Mental Status: She is alert and oriented to person, place, and time.  Psychiatric:        Mood and Affect: Mood normal.  Thought Content: Thought content normal.        Judgment: Judgment normal.      Danielle Jenkins, CMA present for exam  Assessment/Plan:   1. Well woman exam with routine gynecological exam (Primary) Pap 2026  2. IUD (intrauterine device) in place May remain until 2029  3. Perimenopausal symptoms - estradiol  (VIVELLE -DOT) 0.05 MG/24HR patch; Place 1 patch (0.05 mg total) onto the skin 2 (two) times a week.  Dispense: 24 patch; Refill: 4 - progesterone  (PROMETRIUM ) 100 MG capsule; Take 1 capsule (100 mg total) by mouth at bedtime.  Dispense: 90 capsule; Refill: 4  4. Class 1 obesity due to excess calories without serious comorbidity with body mass index (BMI) of 32.0 to 32.9 in adult - semaglutide-weight management (WEGOVY) 2.4 MG/0.75ML SOAJ SQ injection; Inject 2.4 mg into the skin once a week.  Dispense: 9 mL; Refill: 3  5. Depression screen     Return in about 1 year (around 11/17/2024) for Annual.  Josafat Enrico B WHNP-BC 8:30 AM 11/18/2023

## 2024-02-16 ENCOUNTER — Other Ambulatory Visit: Payer: Self-pay | Admitting: Radiology

## 2024-02-16 DIAGNOSIS — Z6832 Body mass index (BMI) 32.0-32.9, adult: Secondary | ICD-10-CM

## 2024-02-16 MED ORDER — WEGOVY 2.4 MG/0.75ML ~~LOC~~ SOAJ: 2.4000 mg | SUBCUTANEOUS | 0 refills | Status: AC

## 2024-02-16 NOTE — Telephone Encounter (Signed)
 Note from pharmacy regarding Wegovy  prescription: Pharmacy comment: Alternative Requested:NEW INSURANCE DOES NOT COVER WEGOVY  WITHOUT PA AND PT NEEDS TO ENROLL IN SOME PROGRAM THROUGH INSURANCE PLEASE ADVICE PT AND ALSO FOLLOW THRU WITH PA REQUEST.

## 2024-02-16 NOTE — Telephone Encounter (Signed)
 Does not have a recent weight on file.Last visit 10/25. Will need appointment in order to complete the PA.

## 2024-02-16 NOTE — Telephone Encounter (Signed)
 Needs follow up for weight check. I would also encourage her to check with insurance, they may have removed coverage from her plan starting 02/10/24.

## 2024-02-16 NOTE — Telephone Encounter (Signed)
 Med refill request: Wegovy  2.4 mg Refill request received, with note PA needed. Last AEX: 11/18/23 Next AEX: none scheduled Follow up:  none scheduled Last MMG (if hormonal med) n/a Last refill: 02/16/2024 Does patient need follow up appointment? (None scheduled) Refill/PA sent to provider for review.

## 2024-02-16 NOTE — Telephone Encounter (Signed)
 MyChart message sent to patient regarding Wegovy  2.4 mg prescription (insurance may no longer cover).  Patient was instructed to contact her insurance company to check on this medication coverage for new year.

## 2024-02-17 ENCOUNTER — Telehealth: Payer: Self-pay

## 2024-02-17 NOTE — Telephone Encounter (Signed)
 Prior authorization request received from covermymeds for Wegovy  2.4 mg.  PA initiated.  Patient notified. KEY: [id number] DX: [id number] DX: E66.09 DX: Z68.32

## 2024-02-28 ENCOUNTER — Other Ambulatory Visit: Payer: Self-pay | Admitting: Radiology

## 2024-02-28 DIAGNOSIS — N951 Menopausal and female climacteric states: Secondary | ICD-10-CM

## 2024-02-29 NOTE — Telephone Encounter (Signed)
 Note received from [facility] pharmacy estradiol  0.05 mg patch ordered .Last refill: 02/26/2024 Sent to provider for review.  Pharmacy comment: Alternative Requested:PLEASE SWITCH TO ONCE A WEEK PATCHES TWICE A WEEK ON BACK ORDER
